# Patient Record
Sex: Female | Born: 1960 | Race: Asian | Hispanic: No | Marital: Married | Smoking: Never smoker
Health system: Southern US, Community
[De-identification: ages and names within clinical notes are randomized; demographics above are authoritative.]

## PROBLEM LIST (undated history)

## (undated) DIAGNOSIS — Z8249 Family history of ischemic heart disease and other diseases of the circulatory system: Secondary | ICD-10-CM

## (undated) DIAGNOSIS — I10 Essential (primary) hypertension: Secondary | ICD-10-CM

## (undated) DIAGNOSIS — E079 Disorder of thyroid, unspecified: Secondary | ICD-10-CM

## (undated) DIAGNOSIS — T7840XA Allergy, unspecified, initial encounter: Secondary | ICD-10-CM

## (undated) DIAGNOSIS — R7303 Prediabetes: Secondary | ICD-10-CM

## (undated) DIAGNOSIS — E785 Hyperlipidemia, unspecified: Secondary | ICD-10-CM

## (undated) HISTORY — DX: Allergy, unspecified, initial encounter: T78.40XA

## (undated) HISTORY — DX: Prediabetes: R73.03

## (undated) HISTORY — DX: Essential (primary) hypertension: I10

## (undated) HISTORY — DX: Disorder of thyroid, unspecified: E07.9

## (undated) HISTORY — DX: Family history of ischemic heart disease and other diseases of the circulatory system: Z82.49

## (undated) HISTORY — DX: Hyperlipidemia, unspecified: E78.5

---

## 2011-10-06 ENCOUNTER — Ambulatory Visit (INDEPENDENT_AMBULATORY_CARE_PROVIDER_SITE_OTHER): Payer: BC Managed Care – PPO

## 2011-10-06 DIAGNOSIS — N951 Menopausal and female climacteric states: Secondary | ICD-10-CM

## 2011-10-06 DIAGNOSIS — E789 Disorder of lipoprotein metabolism, unspecified: Secondary | ICD-10-CM

## 2011-10-06 DIAGNOSIS — I10 Essential (primary) hypertension: Secondary | ICD-10-CM

## 2011-10-06 DIAGNOSIS — R209 Unspecified disturbances of skin sensation: Secondary | ICD-10-CM

## 2012-06-02 ENCOUNTER — Encounter: Payer: BC Managed Care – PPO | Admitting: Family Medicine

## 2012-06-17 ENCOUNTER — Ambulatory Visit: Payer: BC Managed Care – PPO

## 2012-06-17 ENCOUNTER — Encounter: Payer: Self-pay | Admitting: Family Medicine

## 2012-06-17 ENCOUNTER — Ambulatory Visit (INDEPENDENT_AMBULATORY_CARE_PROVIDER_SITE_OTHER): Payer: BC Managed Care – PPO | Admitting: Family Medicine

## 2012-06-17 VITALS — BP 116/72 | HR 87 | Temp 98.2°F | Resp 16 | Ht 62.5 in | Wt 169.4 lb

## 2012-06-17 DIAGNOSIS — Z23 Encounter for immunization: Secondary | ICD-10-CM

## 2012-06-17 DIAGNOSIS — Z01419 Encounter for gynecological examination (general) (routine) without abnormal findings: Secondary | ICD-10-CM

## 2012-06-17 DIAGNOSIS — Z1231 Encounter for screening mammogram for malignant neoplasm of breast: Secondary | ICD-10-CM

## 2012-06-17 DIAGNOSIS — M542 Cervicalgia: Secondary | ICD-10-CM

## 2012-06-17 DIAGNOSIS — Z0000 Encounter for general adult medical examination without abnormal findings: Secondary | ICD-10-CM

## 2012-06-17 DIAGNOSIS — Z124 Encounter for screening for malignant neoplasm of cervix: Secondary | ICD-10-CM

## 2012-06-17 DIAGNOSIS — E785 Hyperlipidemia, unspecified: Secondary | ICD-10-CM

## 2012-06-17 DIAGNOSIS — E039 Hypothyroidism, unspecified: Secondary | ICD-10-CM

## 2012-06-17 LAB — POCT URINALYSIS DIPSTICK
Bilirubin, UA: NEGATIVE
Glucose, UA: NEGATIVE
Ketones, UA: NEGATIVE

## 2012-06-17 LAB — IFOBT (OCCULT BLOOD): IFOBT: NEGATIVE

## 2012-06-17 NOTE — Patient Instructions (Signed)
Keeping You Healthy  Get These Tests  Blood Pressure- Have your blood pressure checked by your healthcare provider at least once a year.  Normal blood pressure is 120/80.  Weight- Have your body mass index (BMI) calculated to screen for obesity.  BMI is a measure of body fat based on height and weight.  You can calculate your own BMI at www.nhlbisupport.com/bmi/  Cholesterol- Have your cholesterol checked every year.  Diabetes- Have your blood sugar checked every year if you have high blood pressure, high cholesterol, a family history of diabetes or if you are overweight.  Pap Smear- Have a pap smear every 1 to 3 years if you have been sexually active.  If you are older than 65 and recent pap smears have been normal you may not need additional pap smears.  In addition, if you have had a hysterectomy  For benign disease additional pap smears are not necessary.  Mammogram-Yearly mammograms are essential for early detection of breast cancer  Screening for Colon Cancer- Colonoscopy starting at age 50. Screening may begin sooner depending on your family history and other health conditions.  Follow up colonoscopy as directed by your Gastroenterologist.  Screening for Osteoporosis- Screening begins at age 65 with bone density scanning, sooner if you are at higher risk for developing Osteoporosis.  Get these medicines  Calcium with Vitamin D- Your body requires 1200-1500 mg of Calcium a day and 800-1000 IU of Vitamin D a day.  You can only absorb 500 mg of Calcium at a time therefore Calcium must be taken in 2 or 3 separate doses throughout the day.  Hormones- Hormone therapy has been associated with increased risk for certain cancers and heart disease.  Talk to your healthcare provider about if you need relief from menopausal symptoms.  Aspirin- Ask your healthcare provider about taking Aspirin to prevent Heart Disease and Stroke.  Get these Immuniztions  Flu shot- Every fall  Pneumonia  shot- Once after the age of 65; if you are younger ask your healthcare provider if you need a pneumonia shot.  Tetanus- Every ten years.  Zostavax- Once after the age of 60 to prevent shingles.  Take these steps  Don't smoke- Your healthcare provider can help you quit. For tips on how to quit, ask your healthcare provider or go to www.smokefree.gov or call 1-800 QUIT-NOW.  Be physically active- Exercise 5 days a week for a minimum of 30 minutes.  If you are not already physically active, start slow and gradually work up to 30 minutes of moderate physical activity.  Try walking, dancing, bike riding, swimming, etc.  Eat a healthy diet- Eat a variety of healthy foods such as fruits, vegetables, whole grains, low fat milk, low fat cheeses, yogurt, lean meats, chicken, fish, eggs, dried beans, tofu, etc.  For more information go to www.thenutritionsource.org  Dental visit- Brush and floss teeth twice daily; visit your dentist twice a year.  Eye exam- Visit your Optometrist or Ophthalmologist yearly.  Drink alcohol in moderation- Limit alcohol intake to one drink or less a day.  Never drink and drive.  Depression- Your emotional health is as important as your physical health.  If you're feeling down or losing interest in things you normally enjoy, please talk to your healthcare provider.  Seat Belts- can save your life; always wear one  Smoke/Carbon Monoxide detectors- These detectors need to be installed on the appropriate level of your home.  Replace batteries at least once a year.  Violence- If anyone   is threatening or hurting you, please tell your healthcare provider.  Living Will/ Health care power of attorney- Discuss with your healthcare provider and family.     Degenerative Disc Disease Degenerative disc disease is a condition caused by the changes that occur in the cushions of the backbone (spinal discs) as you grow older. Spinal discs are soft and compressible discs located  between the bones of the spine (vertebrae). They act like shock absorbers. Degenerative disc disease can affect the wholespine. However, the neck and lower back are most commonly affected. Many changes can occur in the spinal discs with aging, such as:  The spinal discs may dry and shrink.   Small tears may occur in the tough, outer covering of the disc (annulus).   The disc space may become smaller due to loss of water.   Abnormal growths in the bone (spurs) may occur. This can put pressure on the nerve roots exiting the spinal canal, causing pain.   The spinal canal may become narrowed.  CAUSES  Degenerative disc disease is a condition caused by the changes that occur in the spinal discs with aging. The exact cause is not known, but there is a genetic basis for many patients. Degenerative changes can occur due to loss of fluid in the disc. This makes the disc thinner and reduces the space between the backbones. Small cracks can develop in the outer layer of the disc. This can lead to the breakdown of the disc. You are more likely to get degenerative disc disease if you are overweight. Smoking cigarettes and doing heavy work such as weightlifting can also increase your risk of this condition. Degenerative changes can start after a sudden injury. Growth of bone spurs can compress the nerve roots and cause pain.  SYMPTOMS  The symptoms vary from person to person. Some people may have no pain, while others have severe pain. The pain may be so severe that it can limit your activities. The location of the pain depends on the part of your backbone that is affected. You will have neck or arm pain if a disc in the neck area is affected. You will have pain in your back, buttocks, or legs if a disc in the lower back is affected. The pain becomes worse while bending, reaching up, or with twisting movements. The pain may start gradually and then get worse as time passes. It may also start after a major or minor  injury. You may feel numbness or tingling in the arms or legs.  DIAGNOSIS  Your caregiver will ask you about your symptoms and about activities or habits that may cause the pain. He or she may also ask about any injuries, diseases, ortreatments you have had earlier. Your caregiver will examine you to check for the range of movement that is possible in the affected area, to check for strength in your extremities, and to check for sensation in the areas of the arms and legs supplied by different nerve roots. An X-ray of the spine may be taken. Your caregiver may suggest other imaging tests, such as a computerized magnetic scan (MRI), if needed.  TREATMENT  Treatment includes rest, modifying your activities, and applying ice and heat. Your caregiver may prescribe medicines to reduce your pain and may ask you to do some exercises to strengthen your back. In some cases, you may need surgery. You and your caregiver will decide on the treatment that is best for you. HOME CARE INSTRUCTIONS   Follow proper  lifting and walking techniques as advised by your caregiver.   Maintain good posture.   Exercise regularly as advised.   Perform relaxation exercises.   Change your sitting, standing, and sleeping habits as advised. Change positions frequently.   Lose weight as advised.   Stop smoking if you smoke.   Wear supportive footwear.  SEEK MEDICAL CARE IF:  The pain does not go away within 1 to 4 weeks. SEEK IMMEDIATE MEDICAL CARE IF:   The pain is severe.   You notice weakness in your arms, hands, or legs.   You begin to lose control of your bladder or bowel.  MAKE SURE YOU:   Understand these instructions.   Will watch your condition.   Will get help right away if you are not doing well or get worse.  Document Released: 07/27/2007 Document Revised: 09/18/2011 Document Reviewed: 07/27/2007 [facility] Patient Information 2012 [place], [place].

## 2012-06-17 NOTE — Progress Notes (Signed)
Subjective:    Patient ID: Alice Anderson, female    DOB: [birth year], 51 y.o.   MRN: [id number]  HPI This 51 y.o. female has HTN , Hypothyroidism and Lipid disorder. She has no adverse effects  with current medications. She is employed as a Runner, broadcasting/film/video, is married, is a nonsmoker and does not  consume alcohol. Admits to no regular exercise.   Last PAP- 2009 (normal)  Last MMG: 2009 (normal)  Pt is not interested in Colonoscopy at this time.    Review of Systems  Neurological: Positive for numbness.       Occasional left arm numbness, lasting a few minutes; onset 8-10 months ago.  All other systems reviewed and are negative.       Objective:   Physical Exam  Nursing note and vitals reviewed. Constitutional: She is oriented to person, place, and time. She appears well-developed and well-nourished. No distress.  HENT:  Head: Normocephalic and atraumatic.  Right Ear: Hearing, tympanic membrane, external ear and ear canal normal.  Left Ear: Hearing, tympanic membrane and external ear normal.  Nose: Nose normal. No nasal deformity or septal deviation.  Mouth/Throat: Uvula is midline and oropharynx is clear and moist. No oral lesions. Normal dentition. No dental caries.  Eyes: Conjunctivae and EOM are normal. Pupils are equal, round, and reactive to light. No scleral icterus.  Neck: Normal range of motion. Neck supple. No thyromegaly present.       Mild tenderness to left of midline but otherwise normal.  Cardiovascular: Normal rate, regular rhythm, normal heart sounds and intact distal pulses.  Exam reveals no gallop and no friction rub.   No murmur heard. Pulmonary/Chest: Effort normal and breath sounds normal. No respiratory distress. Right breast exhibits no inverted nipple, no mass, no nipple discharge, no skin change and no tenderness. Left breast exhibits no inverted nipple, no mass, no nipple discharge, no skin change and no tenderness. Breasts are symmetrical.  Abdominal: Soft.  Bowel sounds are normal. She exhibits no distension, no pulsatile midline mass and no mass. There is no hepatosplenomegaly. There is no tenderness. There is no guarding and no CVA tenderness.  Genitourinary: Rectum normal and uterus normal. Rectal exam shows no external hemorrhoid, no fissure, no mass, no tenderness and anal tone normal. Guaiac negative stool. There is no rash, tenderness or lesion on the right labia. There is no rash, tenderness or lesion on the left labia. Uterus is not enlarged and not tender. Cervix exhibits no motion tenderness, no discharge and no friability. Right adnexum displays no mass, no tenderness and no fullness. Left adnexum displays no mass, no tenderness and no fullness. No erythema or tenderness around the vagina. No signs of injury around the vagina. No vaginal discharge found.  Musculoskeletal: Normal range of motion. She exhibits no edema and no tenderness.  Lymphadenopathy:    She has no cervical adenopathy.       Right: No inguinal adenopathy present.       Left: No inguinal adenopathy present.  Neurological: She is alert and oriented to person, place, and time. She has normal reflexes. No cranial nerve deficit. She exhibits normal muscle tone. Coordination normal.  Skin: Skin is warm and dry. No pallor.  Psychiatric: She has a normal mood and affect. Her behavior is normal. Judgment and thought content normal.     [facility] reading (PRIMARY) by  Dr. 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        Assessment &  Plan:   1. Routine general medical examination at a health care facility  POCT urinalysis dipstick, IFOBT POC (occult bld, rslt in office)  2. Hypothyroidism  TSH  3. Hyperlipidemia LDL goal < 100  Comprehensive metabolic panel, Lipid panel  4. Need for prophylactic vaccination and inoculation against influenza    5. Neck pain  DG Cervical Spine 2-3 Views  6. Encounter for cervical Pap smear with  pelvic exam  Pap IG w/ reflex to HPV when ASC-U  7. Other screening mammogram  MM Digital Screening   Medication refills will be handled after results of labs are back.

## 2012-06-18 LAB — COMPREHENSIVE METABOLIC PANEL
ALT: 47 U/L — ABNORMAL HIGH (ref 0–35)
CO2: 26 mEq/L (ref 19–32)
Calcium: 9.6 mg/dL (ref 8.4–10.5)
Chloride: 102 mEq/L (ref 96–112)
Potassium: 4.2 mEq/L (ref 3.5–5.3)
Sodium: 137 mEq/L (ref 135–145)
Total Protein: 7.1 g/dL (ref 6.0–8.3)

## 2012-06-18 LAB — PAP IG W/ RFLX HPV ASCU

## 2012-06-18 LAB — TSH: TSH: 1.174 u[IU]/mL (ref 0.350–4.500)

## 2012-06-18 LAB — LIPID PANEL: Cholesterol: 137 mg/dL (ref 0–200)

## 2012-06-19 ENCOUNTER — Encounter: Payer: Self-pay | Admitting: *Deleted

## 2012-06-19 ENCOUNTER — Other Ambulatory Visit: Payer: Self-pay | Admitting: Family Medicine

## 2012-06-19 MED ORDER — LEVOTHYROXINE SODIUM 75 MCG PO TABS: 75.0000 ug | ORAL_TABLET | Freq: Every day | ORAL | Status: DC

## 2012-06-19 MED ORDER — LISINOPRIL 20 MG PO TABS: 20.0000 mg | ORAL_TABLET | Freq: Every day | ORAL | Status: DC

## 2012-06-19 MED ORDER — ATORVASTATIN CALCIUM 40 MG PO TABS: 40.0000 mg | ORAL_TABLET | Freq: Every day | ORAL | Status: DC

## 2012-06-19 NOTE — Progress Notes (Signed)
Quick Note:  I called pt's home number and left a message requesting she call to sch a follow-up appt at [phone] to discuss the neck xrays in detail. ______

## 2012-06-19 NOTE — Progress Notes (Signed)
Quick Note:  Please call pt and advise that the following labs are abnormal... All labs are normal except very slight elevation of liver tests- stable compared to Dec 2012. Lipids are well controlled on current dose of medication. Thyroid test shows that current dose of thyroid medication is adequate. PAP is normal/negative.  I have routed refills of needed meds to pharmacy. I have authorized 90-day supply on all 3 meds with 1 RF.  Copy to pt.   ______

## 2012-06-21 ENCOUNTER — Telehealth: Payer: Self-pay

## 2012-06-21 NOTE — Telephone Encounter (Signed)
LEFT MESSAGE FOR PT TO CALL AND SCHEDULE APPT WITH DR [facility]  WITHIN  ONE WEEK TO DISCUSS XRAYS.

## 2012-06-22 ENCOUNTER — Encounter: Payer: Self-pay | Admitting: Family Medicine

## 2012-06-22 DIAGNOSIS — M5090 Cervical disc disorder, unspecified, unspecified cervical region: Secondary | ICD-10-CM | POA: Insufficient documentation

## 2012-06-22 DIAGNOSIS — E669 Obesity, unspecified: Secondary | ICD-10-CM | POA: Insufficient documentation

## 2012-06-22 DIAGNOSIS — E039 Hypothyroidism, unspecified: Secondary | ICD-10-CM | POA: Insufficient documentation

## 2012-06-22 DIAGNOSIS — E785 Hyperlipidemia, unspecified: Secondary | ICD-10-CM | POA: Insufficient documentation

## 2012-06-24 ENCOUNTER — Encounter: Payer: Self-pay | Admitting: Family Medicine

## 2012-06-24 ENCOUNTER — Encounter: Payer: Self-pay | Admitting: Emergency Medicine

## 2012-09-02 ENCOUNTER — Ambulatory Visit (INDEPENDENT_AMBULATORY_CARE_PROVIDER_SITE_OTHER): Payer: BC Managed Care – PPO | Admitting: Family Medicine

## 2012-09-02 ENCOUNTER — Encounter: Payer: Self-pay | Admitting: Family Medicine

## 2012-09-02 VITALS — BP 118/76 | HR 86 | Temp 98.1°F | Resp 18 | Ht 62.0 in | Wt 165.0 lb

## 2012-09-02 DIAGNOSIS — M62830 Muscle spasm of back: Secondary | ICD-10-CM

## 2012-09-02 DIAGNOSIS — M5380 Other specified dorsopathies, site unspecified: Secondary | ICD-10-CM

## 2012-09-02 DIAGNOSIS — R937 Abnormal findings on diagnostic imaging of other parts of musculoskeletal system: Secondary | ICD-10-CM

## 2012-09-02 MED ORDER — NABUMETONE 500 MG PO TABS: 500.0000 mg | ORAL_TABLET | Freq: Two times a day (BID) | ORAL | Status: DC

## 2012-09-02 MED ORDER — CYCLOBENZAPRINE HCL 5 MG PO TABS: ORAL_TABLET | ORAL | Status: DC

## 2012-09-05 NOTE — Progress Notes (Signed)
S: This pt returns for re-evaluation after being asked to return in September to discuss abnormal C-spine xray result. She continue to have neck discomfort and limited ROM with rotation. She has some radicular symptoms with tingling and burning in left arm. This is intermittent. She gets minimal relief with OTC medications.  ROS: Negative for diaphoresis, fatigue, fever/chhills, CP or tightness, SOB or cough, numbness or weakness in extremities, gait abnormality, dizziness, HA or syncope.  O:  Filed Vitals:   09/02/12 1518  BP: 118/76  Pulse: 86  Temp: 98.1 F (36.7 C)  Resp: 18   GEN: In NAD, WN,WD. HENT: [place]/AT. Benign otherwise. NECK: Decreased ROM w/ rotation esp. to left.  COR: RRR. LUNGS: Normal resp rate and effort. BACK: Spine is straight w/o deformities. MS: Tender in rhomboids and supraspinatous muscles. NEURO: A&O x 3; CNs intact. DTRs 2+/=. Grip is normal in both hands. Gait is normal.  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  A/P: 1. Abnormal x-ray of cervical spine  C-spine results reviewed w/ pt. MR Cervical Spine Wo Contrast  2. Muscle spasm of back  RX: Nabumetone 500 mg 1 tab bid w/ food. RX: Cyclobenzaprine 5 mg 1 or 2 tabs hs for spasms.

## 2012-10-26 ENCOUNTER — Other Ambulatory Visit: Payer: Self-pay

## 2012-11-01 ENCOUNTER — Ambulatory Visit
Admission: RE | Admit: 2012-11-01 | Discharge: 2012-11-01 | Disposition: A | Discharge status: Home / Self Care | Payer: BC Managed Care – PPO | Source: Ambulatory Visit | Attending: Family Medicine | Admitting: Family Medicine

## 2012-11-01 DIAGNOSIS — R937 Abnormal findings on diagnostic imaging of other parts of musculoskeletal system: Secondary | ICD-10-CM

## 2012-11-02 ENCOUNTER — Telehealth: Payer: Self-pay | Admitting: Family Medicine

## 2012-11-02 NOTE — Telephone Encounter (Signed)
Pt had C-spine MRI on 10/27/12; I called to review results w/ pt. Left a message of reassurance ("nothing serious- arthritis in neck") and suggested she sch appt to come in at her convenience so we can review MRI in person.

## 2013-01-27 ENCOUNTER — Ambulatory Visit (INDEPENDENT_AMBULATORY_CARE_PROVIDER_SITE_OTHER): Payer: BC Managed Care – PPO | Admitting: Family Medicine

## 2013-01-27 ENCOUNTER — Encounter: Payer: Self-pay | Admitting: Family Medicine

## 2013-01-27 VITALS — BP 128/84 | HR 99 | Temp 98.2°F | Resp 16 | Ht 62.5 in | Wt 165.0 lb

## 2013-01-27 DIAGNOSIS — I10 Essential (primary) hypertension: Secondary | ICD-10-CM

## 2013-01-27 DIAGNOSIS — E039 Hypothyroidism, unspecified: Secondary | ICD-10-CM

## 2013-01-27 DIAGNOSIS — R252 Cramp and spasm: Secondary | ICD-10-CM

## 2013-01-27 DIAGNOSIS — N951 Menopausal and female climacteric states: Secondary | ICD-10-CM

## 2013-01-27 LAB — T4, FREE: Free T4: 1.05 ng/dL (ref 0.80–1.80)

## 2013-01-27 LAB — TSH: TSH: 0.994 u[IU]/mL (ref 0.350–4.500)

## 2013-01-27 LAB — COMPREHENSIVE METABOLIC PANEL
ALT: 40 U/L — ABNORMAL HIGH (ref 0–35)
Alkaline Phosphatase: 84 U/L (ref 39–117)
CO2: 25 mEq/L (ref 19–32)
Potassium: 4.5 mEq/L (ref 3.5–5.3)
Sodium: 136 mEq/L (ref 135–145)
Total Bilirubin: 0.5 mg/dL (ref 0.3–1.2)
Total Protein: 7.2 g/dL (ref 6.0–8.3)

## 2013-01-27 MED ORDER — LISINOPRIL 20 MG PO TABS: ORAL_TABLET | ORAL | Status: DC

## 2013-01-27 MED ORDER — ATORVASTATIN CALCIUM 20 MG PO TABS: ORAL_TABLET | ORAL | Status: DC

## 2013-01-27 MED ORDER — LEVOTHYROXINE SODIUM 75 MCG PO TABS: 75.0000 ug | ORAL_TABLET | Freq: Every day | ORAL | Status: DC

## 2013-01-27 NOTE — Progress Notes (Signed)
S:  This 52  y.o. female has HTN and is compliant w/ medications. She reports some lightheadedness in early AM along with brief palpitations. She maintains good hydration and nutrition. Medication adverse effect such as cough has no been an issue. She does c/o muscle cramps w/o pain or weakness or tremor.  Thyroid disorder seems to be stable as pt has no changes in energy level, GI function or skin/hair changes.  Pt having hot flushing during the day and night sweats. This is not a daily occurrence but she would like some advise about curtailing these episodes. Last menses > 12 months ago.   ROS: As per HPI. Otherwise noncontributory.  O:  Filed Vitals:   01/27/13 1300  BP: 128/84  Pulse: 99  Temp: 98.2 F (36.8 C)  Resp: 16   GEN: In NAD; WN,WD. HENT: /AT; EOMI w/ clear conj/ sclerae. EACs normal. Oroph clear and moist. NECK: Supple w/o LAN or TMG. COR: RRR. No m/g/r. LUNGS: CTA; normal resp rate and effort. SKIN: W&D; no rashes, erythema or pallor. MS: MAEs; NT muscles w/ good tone, w/o atrophy.  No c/c/e. NEURO: A&O x 3; CNs intact. DTRs 1-2+/= in UEs and LEs. Nonfocal.  A/P: HTN, goal below 140/90 -stable; discussed reducing medication to Lisinopril 20 mg to 1/2 tablet daily. 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  Plan: Vitamin D 25 hydroxy, TSH, Comprehensive metabolic panel, T4, Free, Hepatitis C antibody   Meds ordered this encounter  Medications  . DISCONTD: levothyroxine (SYNTHROID, LEVOTHROID) 75 MCG tablet    Sig: Take 1 tablet (75 mcg total) by  mouth daily.    Dispense:  90 tablet    Refill:  3  . atorvastatin (LIPITOR) 20 MG tablet    Sig: Take 1 tablet at bedtime.    Dispense:  30 tablet    Refill:  5  . DISCONTD: lisinopril (PRINIVIL,ZESTRIL) 20 MG tablet    Sig: Take 1/2 tablet daily to lower blood pressure.    Dispense:  90 tablet    Refill:  3  . levothyroxine (SYNTHROID, LEVOTHROID) 75 MCG tablet    Sig: Take 1 tablet (75 mcg total) by mouth daily.    Dispense:  90 tablet    Refill:  3  . lisinopril (PRINIVIL,ZESTRIL) 20 MG tablet    Sig: Take 1/2 tablet daily to lower blood pressure.    Dispense:  50 tablet    Refill:  3

## 2013-01-27 NOTE — Patient Instructions (Addendum)
Two of your meds have been changed: Atorvastatin 20 mg 1 tablet at bedtime and Lisinopril 20 mg is now 1/2 tablet daily. Take your thyroid medication separate from your other medications.   Menopause Menopause is the normal time of life when menstrual periods stop completely. Menopause is complete when you have missed 12 consecutive menstrual periods. It usually occurs between the ages of 24 to 14, with an average age of 51. Very rarely does a woman develop menopause before 51 years old. At menopause, your ovaries stop producing the female hormones, estrogen and progesterone. This can cause undesirable symptoms and also affect your health. Sometimes the symptoms may occur 4 to 5 years before the menopause begins. There is no relationship between menopause and:  Oral contraceptives.  Number of children you had.  Race.  The age your menstrual periods started (menarche). Heavy smokers and very thin women may develop menopause earlier in life. CAUSES  The ovaries stop producing the female hormones estrogen and progesterone.  Other causes include:  Surgery to remove both ovaries.  The ovaries stop functioning for no known reason.  Tumors of the pituitary gland in the brain.  Medical disease that affects the ovaries and hormone production.  Radiation treatment to the abdomen or pelvis.  Chemotherapy that affects the ovaries. SYMPTOMS   Hot flashes.  Night sweats.  Decrease in sex drive.  Vaginal dryness and thinning of the vagina causing painful intercourse.  Dryness of the skin and developing wrinkles.  Headaches.  Tiredness.  Irritability.  Memory problems.  Weight gain.  Bladder infections.  Hair growth of the face and chest.  Infertility. More serious symptoms include:  Loss of bone (osteoporosis) causing breaks (fractures).  Depression.  Hardening and narrowing of the arteries (atherosclerosis) causing heart attacks and strokes. DIAGNOSIS   When the  menstrual periods have stopped for 12 straight months.  Physical exam.  Hormone studies of the blood. TREATMENT  There are many treatment choices and nearly as many questions about them. The decisions to treat or not to treat menopausal changes is an individual choice made with your caregiver. Your caregiver can discuss the treatments with you. Together, you can decide which treatment will work best for you. Your treatment choices may include:   Hormone therapy (estorgen and progesterone).  Non-hormonal medications.  Treating the individual symptoms with medication (for example antidepressants for depression).  Herbal medications that may help specific symptoms.  Counseling by a psychiatrist or psychologist.  Group therapy.  Lifestyle changes including:  Eating healthy.  Regular exercise.  Limiting caffeine and alcohol.  Stress management and meditation.  No treatment. HOME CARE INSTRUCTIONS   Take the medication your caregiver gives you as directed.  Get plenty of sleep and rest.  Exercise regularly.  Eat a diet that contains calcium (good for the bones) and soy products (acts like estrogen hormone).  Avoid alcoholic beverages.  Do not smoke.  If you have hot flashes, dress in layers.  Take supplements, calcium and vitamin D to strengthen bones.  You can use over-the-counter lubricants or moisturizers for vaginal dryness.  Group therapy is sometimes very helpful.  Acupuncture may be helpful in some cases. SEEK MEDICAL CARE IF:   You are not sure you are in menopause.  You are having menopausal symptoms and need advice and treatment.  You are still having menstrual periods after age 63.  You have pain with intercourse.  Menopause is complete (no menstrual period for 12 months) and you develop vaginal bleeding.  You need a referral to a specialist (gynecologist, psychiatrist or psychologist) for treatment. SEEK IMMEDIATE MEDICAL CARE IF:   You have  severe depression.  You have excessive vaginal bleeding.  You fell and think you have a broken bone.  You have pain when you urinate.  You develop leg or chest pain.  You have a fast pounding heart beat (palpitations).  You have severe headaches.  You develop vision problems.  You feel a lump in your breast.  You have abdominal pain or severe indigestion. Document Released: 12/20/2003 Document Revised: 12/22/2011 Document Reviewed: 07/27/2008 [facility] Patient Information 2013 [place], [place].

## 2013-02-01 ENCOUNTER — Telehealth: Payer: Self-pay

## 2013-02-01 NOTE — Telephone Encounter (Signed)
Spoke with pt advised lab results.

## 2013-02-01 NOTE — Progress Notes (Signed)
Quick Note:  Please contact pt and advise that the following labs are abnormal... Vitamin D level is below normal ; get an over-the-counter supplement Vitamin D3 2000 IU and take it daily. Try to get some daily sun exposure 10-15 minutes most days of the week. Thyroid tests indicate adequate medication dose. Test for Hepatitis C is negative. Chemistry profile, kidney function and liver tests are normal.  Copy to pt. ______

## 2013-02-01 NOTE — Telephone Encounter (Signed)
Patient would like results of her bw done on 01/27/13 please call her back @ [phone] thanks

## 2013-03-17 ENCOUNTER — Telehealth: Payer: Self-pay

## 2013-03-17 DIAGNOSIS — I8390 Asymptomatic varicose veins of unspecified lower extremity: Secondary | ICD-10-CM

## 2013-03-17 NOTE — Telephone Encounter (Signed)
Pt is calling because she is needing a referral for veins she sees dr Gillie Manners and they have talked about getting a referral for her veins  Call back number is [phone]

## 2013-03-18 NOTE — Telephone Encounter (Signed)
Pt called back and verified that she does want to be evaluated for varicose veins. I advised her of referral and gave her [facility] # and address.

## 2013-03-18 NOTE — Telephone Encounter (Signed)
I have completed a referral to [facility] for eval of varicose veins; need to clarify with pt that "varicose veins" is the problem she wants evaluated.

## 2013-05-10 ENCOUNTER — Encounter: Payer: Self-pay | Admitting: Family Medicine

## 2013-05-18 ENCOUNTER — Telehealth: Payer: Self-pay

## 2013-05-18 NOTE — Telephone Encounter (Signed)
[facility] requested any records pertaining to venous insufficiency, leg pain, or varicose veins but there are no notes regarding these issues in chart. Sent fax letting them know.

## 2013-07-14 ENCOUNTER — Encounter: Payer: Self-pay | Admitting: Family Medicine

## 2013-07-14 ENCOUNTER — Telehealth: Payer: Self-pay | Admitting: *Deleted

## 2013-07-14 ENCOUNTER — Ambulatory Visit (INDEPENDENT_AMBULATORY_CARE_PROVIDER_SITE_OTHER): Payer: BC Managed Care – PPO | Admitting: Family Medicine

## 2013-07-14 VITALS — BP 114/73 | HR 76 | Temp 97.8°F | Resp 16 | Ht 63.0 in | Wt 166.0 lb

## 2013-07-14 DIAGNOSIS — E039 Hypothyroidism, unspecified: Secondary | ICD-10-CM

## 2013-07-14 DIAGNOSIS — Z23 Encounter for immunization: Secondary | ICD-10-CM

## 2013-07-14 DIAGNOSIS — Z0000 Encounter for general adult medical examination without abnormal findings: Secondary | ICD-10-CM

## 2013-07-14 DIAGNOSIS — I8393 Asymptomatic varicose veins of bilateral lower extremities: Secondary | ICD-10-CM

## 2013-07-14 DIAGNOSIS — E785 Hyperlipidemia, unspecified: Secondary | ICD-10-CM

## 2013-07-14 DIAGNOSIS — I10 Essential (primary) hypertension: Secondary | ICD-10-CM

## 2013-07-14 DIAGNOSIS — Z1211 Encounter for screening for malignant neoplasm of colon: Secondary | ICD-10-CM

## 2013-07-14 DIAGNOSIS — I8390 Asymptomatic varicose veins of unspecified lower extremity: Secondary | ICD-10-CM | POA: Insufficient documentation

## 2013-07-14 LAB — POCT URINALYSIS DIPSTICK
Bilirubin, UA: NEGATIVE
Glucose, UA: NEGATIVE
Ketones, UA: NEGATIVE
Spec Grav, UA: 1.02
Urobilinogen, UA: 0.2

## 2013-07-14 LAB — LIPID PANEL
Cholesterol: 124 mg/dL (ref 0–200)
LDL Cholesterol: 59 mg/dL (ref 0–99)
Triglycerides: 132 mg/dL (ref <150)
VLDL: 26 mg/dL (ref 0–40)

## 2013-07-14 LAB — COMPREHENSIVE METABOLIC PANEL
ALT: 24 U/L (ref 0–35)
Albumin: 4.3 g/dL (ref 3.5–5.2)
CO2: 28 mEq/L (ref 19–32)
Glucose, Bld: 94 mg/dL (ref 70–99)
Potassium: 4.2 mEq/L (ref 3.5–5.3)
Sodium: 139 mEq/L (ref 135–145)
Total Bilirubin: 0.5 mg/dL (ref 0.3–1.2)
Total Protein: 6.9 g/dL (ref 6.0–8.3)

## 2013-07-14 LAB — IFOBT (OCCULT BLOOD): IFOBT: NEGATIVE

## 2013-07-14 MED ORDER — ATORVASTATIN CALCIUM 20 MG PO TABS: ORAL_TABLET | ORAL | Status: DC

## 2013-07-14 NOTE — Telephone Encounter (Signed)
Called patient to advise her to make appointment for right breast procedure to remove skin lesion with C Jeffery,PA-C. Per Dr Audria Nine. She will call back to schedule appointment.

## 2013-07-14 NOTE — Patient Instructions (Addendum)

## 2013-07-14 NOTE — Progress Notes (Signed)
Subjective:    Patient ID: Alice Anderson, female    DOB: 02/22/1961, 52 y.o.   MRN: [id number]  HPI  This 52 y.o. female is here for annual CPE. [facility] have been treating varicose veins which pt did mention at a previous visit (not documented); at that time, pt was not initially interested in being evaluated by a specialist. However, she did call back in June of this year requesting referral for complete evaluation by a specialist. She has had varicose veins for years and they have been increasingly painful in last 6 months.   HCM: MMG- current.           PAP- 2013 (negative).           IMM- current.           Vision- current.    Review of Systems  Constitutional: Negative.   HENT: Negative.   Eyes: Negative.   Respiratory: Negative.   Cardiovascular: Negative.        Lower ext superficial varicosities.  Gastrointestinal: Negative.   Endocrine: Negative.   Genitourinary: Positive for pelvic pain.       Intermittent LLQ fleeting pain.   Musculoskeletal: Negative.   Skin: Negative.   Allergic/Immunologic: Negative.   Neurological: Negative.   Hematological: Negative.   Psychiatric/Behavioral: Negative.        Objective:   Physical Exam  Nursing note and vitals reviewed. Constitutional: She is oriented to person, place, and time. She appears well-developed and well-nourished. No distress.  HENT:  Head: Normocephalic and atraumatic.  Right Ear: Hearing, tympanic membrane, external ear and ear canal normal.  Left Ear: Hearing and external ear normal.  Nose: Nose normal.  Mouth/Throat: Uvula is midline, oropharynx is clear and moist and mucous membranes are normal. No oral lesions. Normal dentition. No dental caries.  L cerumen impaction.  Eyes: Conjunctivae, EOM and lids are normal. Pupils are equal, round, and reactive to light. No scleral icterus.  Fundoscopic exam:      The right eye shows no arteriolar narrowing, no AV nicking and no papilledema.  The right eye shows red reflex.       The left eye shows no arteriolar narrowing, no AV nicking and no papilledema. The left eye shows red reflex.  Neck: Full passive range of motion without pain. Neck supple. No JVD present. No spinous process tenderness and no muscular tenderness present. Carotid bruit is not present. No mass and no thyromegaly present.  Cardiovascular: Normal rate, regular rhythm, S1 normal, S2 normal, normal heart sounds, intact distal pulses and normal pulses.   No extrasystoles are present. PMI is not displaced.  Exam reveals no gallop and no friction rub.   No murmur heard. Pulses:      Carotid pulses are 2+ on the right side, and 2+ on the left side.      Radial pulses are 2+ on the right side, and 2+ on the left side.       Femoral pulses are 2+ on the right side, and 2+ on the left side.      Popliteal pulses are 2+ on the right side, and 2+ on the left side.       Dorsalis pedis pulses are 2+ on the right side, and 2+ on the left side.       Posterior tibial pulses are 2+ on the right side, and 2+ on the left side.  Pt has lower extremity varicosities bilaterally. No ulcerations are noted. Mildly  tender to palpation.  Pulmonary/Chest: Effort normal and breath sounds normal. No respiratory distress. Right breast exhibits no inverted nipple, no mass, no nipple discharge, no skin change and no tenderness. Left breast exhibits no inverted nipple, no mass, no nipple discharge, no skin change and no tenderness. Breasts are symmetrical.    Abdominal: Soft. Normal appearance and bowel sounds are normal. She exhibits no distension, no pulsatile midline mass and no mass. There is no hepatosplenomegaly. There is no tenderness. There is no rebound, no guarding and no CVA tenderness.  Genitourinary: Vagina normal. There is no rash, tenderness or lesion on the right labia. There is no rash, tenderness or lesion on the left labia.  Musculoskeletal: Normal range of motion. She exhibits  no edema and no tenderness.  Lymphadenopathy:       Head (right side): No submental, no submandibular, no tonsillar, no posterior auricular and no occipital adenopathy present.       Head (left side): No submental, no submandibular, no tonsillar, no posterior auricular and no occipital adenopathy present.    She has no cervical adenopathy.    She has no axillary adenopathy.       Right: No inguinal and no supraclavicular adenopathy present.       Left: No inguinal and no supraclavicular adenopathy present.  Neurological: She is alert and oriented to person, place, and time. She has normal strength and normal reflexes. She displays no atrophy. No cranial nerve deficit or sensory deficit. She exhibits normal muscle tone. Coordination and gait normal.  Skin: Skin is warm, dry and intact. Lesion noted. No rash noted. She is not diaphoretic. No cyanosis or erythema. No pallor. Nails show no clubbing.  Upper R breast- 1 cm black-head w/ defect in overlying skin.  Psychiatric: She has a normal mood and affect. Her speech is normal and behavior is normal. Judgment and thought content normal. Cognition and memory are normal.     Results for orders placed in visit on 07/14/13  POCT URINALYSIS DIPSTICK      Result Value Range   Color, UA yellow     Clarity, UA clear     Glucose, UA neg     Bilirubin, UA neg     Ketones, UA neg     Spec Grav, UA 1.020     Blood, UA trace     pH, UA 5.0     Protein, UA neg     Urobilinogen, UA 0.2     Nitrite, UA neg     Leukocytes, UA Negative    IFOBT (OCCULT BLOOD)      Result Value Range   IFOBT Negative      ECG: NSR; no ST-TW changes or ectopy.      Assessment & Plan:  Routine general medical examination at a health care facility - Plan: POCT urinalysis dipstick, IFOBT POC (occult bld, rslt in office), Comprehensive metabolic panel, EKG 12-Lead  Hypothyroidism - Continue current medication dose pending labs.   Plan: TSH, T3, Free, T4,  Free  Hyperlipidemia LDL goal < 100 - Plan: Lipid panel  HTN, goal below 140/90 - Stable and controlled; continue current medication.  Plan: Comprehensive metabolic panel  Superficial varicosities, bilateral- pt needs this note faxed to [facility] at [address]  [place]- [phone].  Need for prophylactic vaccination and inoculation against influenza -  Plan: Flu Vaccine QUAD 36+ mos IM  Screening for colorectal cancer - Plan: Ambulatory referral to Gastroenterology  Pt will return to clinic for excision of skin lesion by PA-C.

## 2013-07-15 LAB — T4, FREE: Free T4: 1.13 ng/dL (ref 0.80–1.80)

## 2013-07-15 LAB — T3, FREE: T3, Free: 3.6 pg/mL (ref 2.3–4.2)

## 2013-07-15 NOTE — Progress Notes (Signed)
Quick Note:  Please advise pt regarding following labs...  Lipid panel looks good but HDL ("good") cholesterol needs to be higher; regular exercise and healthy nutrition (The Mediterranean Diet is an excellent guide) can help increase HDL. Your heart disease risk is still very low so that is great news.  Thyroid test (TSH) is not in the normal range but the other 2 thyroid tests (Free T3 and Free T4) that measure the active hormone are in the normal range. I will not change medication dose at this time but these numbers will need to be rechecked in 3-4 months.  Please schedule a visit for January or February to monitor this important medication. All other labs are normal.  Copy to pt.   ______

## 2013-07-20 ENCOUNTER — Encounter: Payer: Self-pay | Admitting: Family Medicine

## 2013-07-20 ENCOUNTER — Telehealth: Payer: Self-pay

## 2013-07-20 NOTE — Telephone Encounter (Signed)
PT is calling because she is wanting to know her lab results from last week She states if she doesn't answer you can leave it on her voicemail Call back number is [phone]

## 2013-07-20 NOTE — Telephone Encounter (Signed)
Advised patient to call if she wants specifics, letter mailed. Left message.

## 2013-09-12 ENCOUNTER — Encounter: Payer: Self-pay | Admitting: Family Medicine

## 2013-09-30 ENCOUNTER — Other Ambulatory Visit: Payer: Self-pay | Admitting: Family Medicine

## 2013-12-23 ENCOUNTER — Ambulatory Visit (INDEPENDENT_AMBULATORY_CARE_PROVIDER_SITE_OTHER): Payer: BC Managed Care – PPO | Admitting: Family Medicine

## 2013-12-23 ENCOUNTER — Encounter: Payer: Self-pay | Admitting: Family Medicine

## 2013-12-23 VITALS — BP 120/78 | HR 72 | Temp 98.2°F | Resp 16 | Ht 63.0 in | Wt 167.0 lb

## 2013-12-23 DIAGNOSIS — E039 Hypothyroidism, unspecified: Secondary | ICD-10-CM

## 2013-12-23 DIAGNOSIS — M5030 Other cervical disc degeneration, unspecified cervical region: Secondary | ICD-10-CM

## 2013-12-23 DIAGNOSIS — I10 Essential (primary) hypertension: Secondary | ICD-10-CM

## 2013-12-23 LAB — CBC WITH DIFFERENTIAL/PLATELET
BASOS ABS: 0 10*3/uL (ref 0.0–0.1)
BASOS PCT: 0 % (ref 0–1)
EOS PCT: 1 % (ref 0–5)
Eosinophils Absolute: 0.1 10*3/uL (ref 0.0–0.7)
HCT: 37.9 % (ref 36.0–46.0)
Hemoglobin: 13 g/dL (ref 12.0–15.0)
Lymphocytes Relative: 49 % — ABNORMAL HIGH (ref 12–46)
Lymphs Abs: 2.5 10*3/uL (ref 0.7–4.0)
MCH: 28.4 pg (ref 26.0–34.0)
MCHC: 34.3 g/dL (ref 30.0–36.0)
MCV: 82.8 fL (ref 78.0–100.0)
Monocytes Absolute: 0.3 10*3/uL (ref 0.1–1.0)
Monocytes Relative: 6 % (ref 3–12)
NEUTROS ABS: 2.3 10*3/uL (ref 1.7–7.7)
Neutrophils Relative %: 44 % (ref 43–77)
PLATELETS: 348 10*3/uL (ref 150–400)
RBC: 4.58 MIL/uL (ref 3.87–5.11)
RDW: 13.5 % (ref 11.5–15.5)
WBC: 5.2 10*3/uL (ref 4.0–10.5)

## 2013-12-23 LAB — THYROID PANEL WITH TSH
FREE THYROXINE INDEX: 3.7 (ref 1.0–3.9)
T3 UPTAKE: 35 % (ref 22.5–37.0)
T4, Total: 10.7 ug/dL (ref 5.0–12.5)
TSH: 0.258 u[IU]/mL — AB (ref 0.350–4.500)

## 2013-12-23 LAB — POCT GLYCOSYLATED HEMOGLOBIN (HGB A1C): HEMOGLOBIN A1C: 5.8

## 2013-12-23 MED ORDER — ATORVASTATIN CALCIUM 20 MG PO TABS: ORAL_TABLET | ORAL | Status: DC

## 2013-12-23 MED ORDER — LISINOPRIL 20 MG PO TABS: ORAL_TABLET | ORAL | Status: DC

## 2013-12-23 MED ORDER — LEVOTHYROXINE SODIUM 75 MCG PO TABS: 75.0000 ug | ORAL_TABLET | Freq: Every day | ORAL | Status: DC

## 2013-12-23 NOTE — Patient Instructions (Signed)
I have referred you to [facility] for treatment of cervical spine degenerative disc disease.  We have discussed nutritional changes that you need to make so that you do not develop Diabetes- better food choices, smaller portions and regular physical exercise.

## 2013-12-23 NOTE — Progress Notes (Signed)
S:  This 53 y.o. female has hypothyroidism and HTN, well controlled. Last thyroid profile showed normal Free T3 and T4 but TSH was below normal range. Pt feels good but would like to feel more energetic. She sleeps well and has a good appetite but carb intake is excessive. She exercises 1-2 times a week.   HTN- checks BP at home on occasion; SBP 110-130. Pt denies diaphoresis, vision changes, CP or tightness, palpitations, SOB or DOE, cough, edema, AH, dizziness, numbness, weakness, tremor or syncope.   Pt has intermittent numbness in both arms, mostly occurs w/ certain arm positions (when holding arms up at shoulder level while driving). She denies pain, tremors, muscle weakness or fatigue or poor grip.  Patient Active Problem List   Diagnosis Date Noted  . 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 HTN, goal below 140/90 01/27/2013  . Hypothyroidism 06/22/2012  . Hyperlipidemia LDL goal < 100 06/22/2012  . Neck pain 06/22/2012  . Obesity (BMI 30.0-34.9) 06/22/2012   PMHx, Surg Hx, Soc and Fam Hx reviewed.  MEDICATIONS reconciled.  ROS: As per HPI.   O: Filed Vitals:   12/23/13 0922  BP: 120/78  Pulse: 72  Temp: 98.2 F (36.8 C)  Resp: 16   GEN: In NAD; WN,WD. HENT: [place]/AT; EOMI w/ clear conj/sclerae. Otherwise unremarkable. NECK: No TMG or LAN. Decreased ROM with posterior muscle tenderness. No bony tenderness.  COR: RRR. No edema. LUNGS: Unlabored resp. SKIN: W&D; intact w/o erythema, diaphoresis or jaundice. MS: MAEs; good ROM in shoulder joints but discomfort at extremes.  Supraspinatus muscle spasms. No muscle atrophy. NEURO: A&O x 3; CNs intact. Nonfocal.   Results for orders placed in visit on 12/23/13  POCT GLYCOSYLATED HEMOGLOBIN (HGB A1C)      Result Value Ref Range   Hemoglobin A1C 5.8      A/P: Hypothyroidism - Continue current dose of Levothyroxine 75 mcg pending lab results. Plan: Thyroid Panel With TSH  HTN, goal below 140/90 - Stabel on current medication  dose. Screen for DM. Plan: POCT glycosylated hemoglobin (Hb A1C), CBC with Differential  DDD (degenerative disc disease), cervical - Plan: Ambulatory referral to Physical Therapy  Meds ordered this encounter  Medications  . atorvastatin (LIPITOR) 20 MG tablet    Sig: Take 1 tablet at bedtime.    Dispense:  30 tablet    Refill:  6  . levothyroxine (SYNTHROID, LEVOTHROID) 75 MCG tablet    Sig: Take 1 tablet (75 mcg total) by mouth daily.    Dispense:  90 tablet    Refill:  2  . lisinopril (PRINIVIL,ZESTRIL) 20 MG tablet    Sig: Take 1/2 tablet daily to lower blood pressure.    Dispense:  50 tablet    Refill:  3

## 2013-12-25 ENCOUNTER — Other Ambulatory Visit: Payer: Self-pay | Admitting: Family Medicine

## 2013-12-25 DIAGNOSIS — E039 Hypothyroidism, unspecified: Secondary | ICD-10-CM

## 2013-12-25 NOTE — Progress Notes (Signed)
Quick Note:  Please advise pt regarding following labs... Complete blood counts are normal. Thyroid function tests indicate a slight dose reduction may be indicated. I am going to leave the dose as it is because you stated that current dose is good but a little higher would be better. Based on our conversation, I am leaving you thyroid medication where it is for now. It will need to be checked again in about 3 months. Be sure to take this medication by itself, separate from other medications.  I am going to place a future order for labs; you will come in just to have blood drawn in about 3 months. You will need to see me in 6 months.  Copy to pt. ______

## 2013-12-27 ENCOUNTER — Encounter: Payer: Self-pay | Admitting: *Deleted

## 2014-05-04 ENCOUNTER — Ambulatory Visit (INDEPENDENT_AMBULATORY_CARE_PROVIDER_SITE_OTHER): Payer: BC Managed Care – PPO | Admitting: Family Medicine

## 2014-05-04 ENCOUNTER — Encounter: Payer: Self-pay | Admitting: Family Medicine

## 2014-05-04 ENCOUNTER — Ambulatory Visit (INDEPENDENT_AMBULATORY_CARE_PROVIDER_SITE_OTHER): Payer: BC Managed Care – PPO

## 2014-05-04 VITALS — BP 122/76 | HR 91 | Temp 98.4°F | Resp 16 | Ht 62.75 in | Wt 164.8 lb

## 2014-05-04 DIAGNOSIS — M25512 Pain in left shoulder: Secondary | ICD-10-CM

## 2014-05-04 DIAGNOSIS — M25519 Pain in unspecified shoulder: Secondary | ICD-10-CM

## 2014-05-04 MED ORDER — TRAMADOL-ACETAMINOPHEN 37.5-325 MG PO TABS: 1.0000 | ORAL_TABLET | Freq: Three times a day (TID) | ORAL | Status: DC | PRN

## 2014-05-04 NOTE — Patient Instructions (Signed)
I am going to refer you to an ORTHOPEDIST who may be able to work you in on Friday or Monday.  I have prescribed some medication to reduce the pain. Our office will contact you about your appointment with the specialist.

## 2014-05-04 NOTE — Progress Notes (Signed)
S:  This 53 y.o. Female is here for evaluation of left shoulder pain. She has hx of C-spine DDD, treated w/ PT and home exercises. While in treatment at [facility]; therapist discovered decreased ROM in left shoulder. Therapy seemed to worsen pain. No relief w/ OTC NSAIDs. Pt recalls a fall about 4-5 months ago onto L shoulder and then a possible strain injury while lifting bags of mulch. She cannot sleep on L side and shoulder area is exteremely sensitive to touch. Pain seems to radiate into upper arm and forearm muscles. She denies numbness or paresthesias. She cannot lift anything more than 4-5 lbs with L hand.  Patient Active Problem List   Diagnosis Date Noted  . 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 HTN, goal below 140/90 01/27/2013  . Hypothyroidism 06/22/2012  . Hyperlipidemia LDL goal < 100 06/22/2012  . Cervical neck pain with evidence of disc disease 06/22/2012  . Obesity (BMI 30.0-34.9) 06/22/2012    Prior to Admission medications   Medication Sig Start Date End Date Taking? Authorizing Provider  atorvastatin (LIPITOR) 20 MG tablet Take 1 tablet at bedtime. 12/23/13  Yes Maurice MarchBarbara B Raquelle Pietro, MD  levothyroxine (SYNTHROID, LEVOTHROID) 75 MCG tablet Take 1 tablet (75 mcg total) by mouth daily. 12/23/13  Yes Maurice MarchBarbara B Rollande Thursby, MD  lisinopril (PRINIVIL,ZESTRIL) 20 MG tablet Take 1/2 tablet daily to lower blood pressure. 12/23/13  Yes Maurice MarchBarbara B Ryshawn Sanzone, MD   No Known Allergies  PMHx, Surg Hx, Soc and Fam Hx reviewed.  ROS; AS per HPI. Negative for fever/chills, fatigue, neck pain/stiffness, back pain, myalgias/arthralgias, HA, weakness, numbness or syncope.  O: Filed Vitals:   05/04/14 1524  BP: 122/76  Pulse: 91  Temp: 98.4 F (36.9 C)  Resp: 16   GEN: In NAD; WN,WD. HENT: [place]/AT; EOMI w/ clear conj/sclerae. Ext ears/nose and oroph unremarkable. NECK: Supple with minimal muscle spasms posteriorly. COR: RRR. No edema. Radial pulses intact (2+/=). LUNGS: Normal resp  effort. MS: Normal ROM in R shoulder. L shoulder- Rhomboid/posterior deltoid mildly tender w/ spasm; tender in axilla. Decreased ROM (abduction to 90 degrees, internal rotation). Speed's test weakly +; Yergason's test weakly +. Bicipital tendon nontender.  Neer's test+ evidence of impingement. Neurovascular intact. NEURO: A&O x 3; CNs intact. Motor function/strength: RUE-4+/5; LUE 3+/=. Nonfocal.   [facility] reading (PRIMARY) by  Dr. Audria NineMcPherson:  L shoulder- Normal joint space w/o fracture or dislocation. Very mild degenerative changes.   A/P: Left shoulder pain - Plan: DG Shoulder Left-  Pt leaving city on Tuesday (05/09/14) for extended vacation. Phone call placed to Dr. Barbaraann FasterNitka's office by Georg Ruddleynthia Joyce, CMA; they will see pt on Monday. Pt aware. Advised against strenuous activity or over-exertion. RX: Utracet 1 tablet every 8 hours prn pain.

## 2014-05-29 ENCOUNTER — Telehealth: Payer: Self-pay

## 2014-05-29 MED ORDER — ATORVASTATIN CALCIUM 20 MG PO TABS: ORAL_TABLET | ORAL | Status: DC

## 2014-05-29 NOTE — Telephone Encounter (Signed)
LM for pt- it is time for her follow up per Dr McPherson's lab note in March. Sent in 30 day supply.

## 2014-05-29 NOTE — Telephone Encounter (Signed)
Patient requesting a refill on her 'Liptor 20mg ". Please call in to [facility] on [address] Patients call back number is [phone]

## 2014-06-02 ENCOUNTER — Other Ambulatory Visit (INDEPENDENT_AMBULATORY_CARE_PROVIDER_SITE_OTHER): Payer: BC Managed Care – PPO | Admitting: *Deleted

## 2014-06-02 DIAGNOSIS — I10 Essential (primary) hypertension: Secondary | ICD-10-CM

## 2014-06-02 DIAGNOSIS — E039 Hypothyroidism, unspecified: Secondary | ICD-10-CM

## 2014-06-02 DIAGNOSIS — E785 Hyperlipidemia, unspecified: Secondary | ICD-10-CM

## 2014-06-02 LAB — THYROID PANEL WITH TSH
FREE THYROXINE INDEX: 3 (ref 1.0–3.9)
T3 Uptake: 34.8 % (ref 22.5–37.0)
T4, Total: 8.7 ug/dL (ref 5.0–12.5)
TSH: 1.332 u[IU]/mL (ref 0.350–4.500)

## 2014-06-02 LAB — COMPLETE METABOLIC PANEL WITH GFR
ALT: 28 U/L (ref 0–35)
AST: 24 U/L (ref 0–37)
Albumin: 4.1 g/dL (ref 3.5–5.2)
Alkaline Phosphatase: 57 U/L (ref 39–117)
BILIRUBIN TOTAL: 0.5 mg/dL (ref 0.2–1.2)
BUN: 18 mg/dL (ref 6–23)
CALCIUM: 9.1 mg/dL (ref 8.4–10.5)
CHLORIDE: 104 meq/L (ref 96–112)
CO2: 29 meq/L (ref 19–32)
Creat: 0.65 mg/dL (ref 0.50–1.10)
GFR, Est African American: >89 mL/min
GLUCOSE: 85 mg/dL (ref 70–99)
Potassium: 4.3 mEq/L (ref 3.5–5.3)
SODIUM: 141 meq/L (ref 135–145)
Total Protein: 6.6 g/dL (ref 6.0–8.3)

## 2014-06-02 LAB — LIPID PANEL
CHOL/HDL RATIO: 3.4 ratio
Cholesterol: 152 mg/dL (ref 0–200)
HDL: 45 mg/dL (ref >39)
LDL CALC: 79 mg/dL (ref 0–99)
Triglycerides: 140 mg/dL (ref <150)
VLDL: 28 mg/dL (ref 0–40)

## 2014-06-02 NOTE — Progress Notes (Signed)
Pt her for lab draw

## 2014-07-05 ENCOUNTER — Encounter: Payer: Self-pay | Admitting: Family Medicine

## 2014-07-06 ENCOUNTER — Ambulatory Visit: Payer: BC Managed Care – PPO | Admitting: Family Medicine

## 2014-07-20 ENCOUNTER — Ambulatory Visit: Payer: BC Managed Care – PPO | Admitting: Family Medicine

## 2014-07-21 ENCOUNTER — Ambulatory Visit (INDEPENDENT_AMBULATORY_CARE_PROVIDER_SITE_OTHER): Payer: BC Managed Care – PPO | Admitting: Family Medicine

## 2014-07-21 ENCOUNTER — Encounter: Payer: Self-pay | Admitting: Family Medicine

## 2014-07-21 VITALS — BP 124/78 | HR 97 | Temp 98.7°F | Resp 16 | Ht 64.5 in | Wt 167.0 lb

## 2014-07-21 DIAGNOSIS — E039 Hypothyroidism, unspecified: Secondary | ICD-10-CM

## 2014-07-21 DIAGNOSIS — I10 Essential (primary) hypertension: Secondary | ICD-10-CM

## 2014-07-21 DIAGNOSIS — E785 Hyperlipidemia, unspecified: Secondary | ICD-10-CM

## 2014-07-21 MED ORDER — LEVOTHYROXINE SODIUM 75 MCG PO TABS: 75.0000 ug | ORAL_TABLET | Freq: Every day | ORAL | Status: DC

## 2014-07-21 MED ORDER — ATORVASTATIN CALCIUM 20 MG PO TABS: ORAL_TABLET | ORAL | Status: DC

## 2014-07-21 MED ORDER — LISINOPRIL 20 MG PO TABS: ORAL_TABLET | ORAL | Status: DC

## 2014-07-21 NOTE — Progress Notes (Signed)
I have reviewed documentation per T. Brewington, PA-C, interviewed pt and discussed A/P with PA-C. Agree with documentation; nothing to add.  Pt will RTC in Dec 2015 for CPE. No labs at that time. Refill all meds at this visit x 1 year.

## 2014-07-21 NOTE — Progress Notes (Signed)
Subjective:    Patient ID: Alice Anderson, female    DOB: [birth year], 53 y.o.   MRN: [id number]  HPI Patient presents for medication refill. Has missed two dose of atorvastatin, but has been compliant with all other medication. Denies medication side effects. Is a vegetarian and reports eating healthy. Drinks 5 8 oz glasses of water, 1 cup of coffee, and 1 cup of tea per day.  Is no longer having physical therapy and says her shoulder is much improved. She is now able to raise arm.  Has seasonal allergies and has intermittent congestion this time of year. Zyrtec during the day and Benadryl at night improves sx.   Health maintenance: [facility]    Review of Systems  Constitutional: Negative for fever, activity change and fatigue.  HENT: Positive for congestion. Negative for ear discharge, ear pain, hearing loss, rhinorrhea, sinus pressure and sore throat.   Eyes: Negative for discharge, itching and visual disturbance.  Respiratory: Negative for cough, chest tightness and shortness of breath.   Cardiovascular: Negative for chest pain, palpitations and leg swelling.  Gastrointestinal: Negative for nausea, vomiting, diarrhea and constipation.  Endocrine: Negative.   Genitourinary: Negative.   Musculoskeletal: Negative for arthralgias (shoulder improved), myalgias, neck pain and neck stiffness.  Allergic/Immunologic: Positive for environmental allergies.  Neurological: Negative for dizziness, light-headedness and headaches.       Objective:   Physical Exam  Constitutional: She is oriented to person, place, and time. She appears well-developed and well-nourished. No distress.  HENT:  Head: Normocephalic and atraumatic.  Right Ear: External ear normal.  Left Ear: External ear normal.  Nose: Nose normal.  Mouth/Throat: Oropharynx is clear and moist. No oropharyngeal exudate.  Eyes: Conjunctivae are normal. Right eye exhibits no discharge. Left eye exhibits no  discharge.  Neck: Neck supple. No JVD present. No thyromegaly present.  Cardiovascular: Normal rate, regular rhythm, normal heart sounds and intact distal pulses.  Exam reveals no gallop and no friction rub.   No murmur heard. Pulmonary/Chest: Effort normal and breath sounds normal. No stridor. No respiratory distress. She has no wheezes. She has no rales.  Abdominal: Soft. Bowel sounds are normal. She exhibits no distension and no mass. There is no tenderness.  Musculoskeletal: Normal range of motion. She exhibits no edema and no tenderness.  Lymphadenopathy:    She has no cervical adenopathy.  Neurological: She is oriented to person, place, and time.  Skin: Skin is warm and dry. No rash noted. She is not diaphoretic. No erythema.  Psychiatric: She has a normal mood and affect. Her behavior is normal. Judgment and thought content normal.   Blood pressure 124/78, pulse 97, temperature 98.7 F (37.1 C), temperature source Oral, resp. rate 16, height 5' 4.5" (1.638 m), weight 167 lb (75.751 kg), SpO2 97.00%.  Results for orders placed in visit on 06/02/14  COMPLETE METABOLIC PANEL WITH GFR      Result Value Ref Range   Sodium 141  135 - 145 mEq/L   Potassium 4.3  3.5 - 5.3 mEq/L   Chloride 104  96 - 112 mEq/L   CO2 29  19 - 32 mEq/L   Glucose, Bld 85  70 - 99 mg/dL   BUN 18  6 - 23 mg/dL   Creat 3.150.65  4.000.50 - 8.671.10 mg/dL   Total Bilirubin 0.5  0.2 - 1.2 mg/dL   Alkaline Phosphatase 57  39 - 117 U/L   AST 24  0 - 37  U/L   ALT 28  0 - 35 U/L   Total Protein 6.6  6.0 - 8.3 g/dL   Albumin 4.1  3.5 - 5.2 g/dL   Calcium 9.1  8.4 - 16.110.5 mg/dL   GFR, Est African American >89     GFR, Est Non African American >89    THYROID PANEL WITH TSH      Result Value Ref Range   T4, Total 8.7  5.0 - 12.5 ug/dL   T3 Uptake 09.634.8  04.522.5 - 37.0 %   Free Thyroxine Index 3.0  1.0 - 3.9   TSH 1.332  0.350 - 4.500 uIU/mL  LIPID PANEL      Result Value Ref Range   Cholesterol 152  0 - 200 mg/dL    Triglycerides 409140  <811<150 mg/dL   HDL 45  >91>39 mg/dL   Total CHOL/HDL Ratio 3.4     VLDL 28  0 - 40 mg/dL   LDL Cholesterol 79  0 - 99 mg/dL       Assessment & Plan:  1. HTN, goal below 140/90 Well controlled with medication. - lisinopril (PRINIVIL,ZESTRIL) 20 MG tablet; Take 1/2 tablet daily to lower blood pressure.  Dispense: 50 tablet; Refill: 3  2. Hyperlipidemia with target LDL less than 100 Last labs within range.  - atorvastatin (LIPITOR) 20 MG tablet; Take 1 tablet at bedtime.  Dispense: 30 tablet; Refill: 11 3. Hypothyroidism, unspecified hypothyroidism type Last labs within range. - levothyroxine (SYNTHROID, LEVOTHROID) 75 MCG tablet; Take 1 tablet (75 mcg total) by mouth daily.  Dispense: 90 tablet; Refill: 3   Nicklos Gaxiola PA-C  [facility] 07/21/2014 3:38 PM

## 2014-09-26 ENCOUNTER — Encounter: Payer: Self-pay | Admitting: Family Medicine

## 2014-10-03 ENCOUNTER — Encounter: Payer: BC Managed Care – PPO | Admitting: Family Medicine

## 2014-11-10 ENCOUNTER — Ambulatory Visit (INDEPENDENT_AMBULATORY_CARE_PROVIDER_SITE_OTHER): Payer: BC Managed Care – PPO | Admitting: Family Medicine

## 2014-11-10 ENCOUNTER — Encounter: Payer: Self-pay | Admitting: Family Medicine

## 2014-11-10 VITALS — BP 114/76 | HR 86 | Temp 98.3°F | Resp 16 | Ht 63.0 in | Wt 167.8 lb

## 2014-11-10 DIAGNOSIS — Z01419 Encounter for gynecological examination (general) (routine) without abnormal findings: Secondary | ICD-10-CM

## 2014-11-10 DIAGNOSIS — I10 Essential (primary) hypertension: Secondary | ICD-10-CM

## 2014-11-10 DIAGNOSIS — Z1211 Encounter for screening for malignant neoplasm of colon: Secondary | ICD-10-CM

## 2014-11-10 DIAGNOSIS — Z0000 Encounter for general adult medical examination without abnormal findings: Secondary | ICD-10-CM

## 2014-11-10 DIAGNOSIS — Z124 Encounter for screening for malignant neoplasm of cervix: Secondary | ICD-10-CM

## 2014-11-10 LAB — POCT URINALYSIS DIPSTICK
BILIRUBIN UA: NEGATIVE
Glucose, UA: NEGATIVE
KETONES UA: NEGATIVE
Leukocytes, UA: NEGATIVE
Nitrite, UA: NEGATIVE
PROTEIN UA: NEGATIVE
Spec Grav, UA: 1.015
Urobilinogen, UA: 0.2
pH, UA: 5

## 2014-11-10 LAB — POCT GLYCOSYLATED HEMOGLOBIN (HGB A1C): HEMOGLOBIN A1C: 5.8

## 2014-11-10 NOTE — Progress Notes (Signed)
Subjective:    Patient ID: Alice Anderson, female    DOB: January 13, 1961, 54 y.o.   MRN: [id number]  HPI  This 54 y.o. Female is here for CPE/PAP. She has well controlled HTN, hypothyroidism and lipid disorder. She is compliant w/ all medications w/o adverse effects.  HCM: MMG- Current.           PAP- Jan 2015 (performed elsewhere).           CRS- Needs to be scheduled.           IMM- Current.           Vision- Current.           Dental- Current.  Patient Active Problem List   Diagnosis Date Noted  . 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 HTN, goal below 140/90 01/27/2013  . Hypothyroidism 06/22/2012  . Hyperlipidemia with target LDL less than 100 06/22/2012  . Cervical neck pain with evidence of disc disease 06/22/2012  . Obesity (BMI 30.0-34.9) 06/22/2012    Prior to Admission medications   Medication Sig Start Date End Date Taking? Authorizing Provider  atorvastatin (LIPITOR) 20 MG tablet Take 1 tablet at bedtime. 07/21/14  Yes Tishira R Brewington, PA-C  cetirizine (ZYRTEC) 10 MG tablet Take 10 mg by mouth daily.   Yes Historical Provider, MD  levothyroxine (SYNTHROID, LEVOTHROID) 75 MCG tablet Take 1 tablet (75 mcg total) by mouth daily. 07/21/14  Yes Tishira R Brewington, PA-C  lisinopril (PRINIVIL,ZESTRIL) 20 MG tablet Take 1/2 tablet daily to lower blood pressure. 07/21/14  Yes Tishira R Brewington, PA-C  traMADol-acetaminophen (ULTRACET) 37.5-325 MG per tablet Take 1 tablet by mouth every 8 (eight) hours as needed. Patient not taking: Reported on 11/10/2014 05/04/14   Maurice March, MD    Past Surgical History  Procedure Laterality Date  . Cesarean section  1998 and 1999    History   Social History  . Marital Status: Married    Spouse Name: N/A    Number of Children: N/A  . Years of Education: college   Occupational History  . teacher    Social History Main Topics  . Smoking status: Never Smoker   . Smokeless tobacco: Never Used  . Alcohol Use: No  . Drug  Use: No  . Sexual Activity: Yes    Birth Control/ Protection: Condom   Other Topics Concern  . Not on file   Social History Narrative    Family History  Problem Relation Age of Onset  . Hypothyroidism Mother 63  . Heart disease Mother 72    chf  . Heart disease Father 6    heart failure  . Diabetes Sister   . Hypertension Sister   . Thyroid disease Sister   . Hypertension Brother      Review of Systems  Constitutional: Negative.   HENT: Positive for sinus pressure.   Eyes: Negative.   Respiratory: Negative.   Cardiovascular: Negative.   Gastrointestinal: Negative.   Endocrine: Negative.   Genitourinary: Negative.   Musculoskeletal: Negative.   Skin: Negative.   Allergic/Immunologic: Negative.   Neurological: Negative.   Hematological: Negative.   Psychiatric/Behavioral: Negative.       Objective:   Physical Exam  Constitutional: She is oriented to person, place, and time. Vital signs are normal. She appears well-developed and well-nourished. No distress.  Blood pressure 114/76, pulse 86, temperature 98.3 F (36.8 C), temperature source Oral, resp. rate 16, height  (1.6 m), weight 167 lb 12.8 oz (76.114  kg), SpO2 97 %.   HENT:  Head: Normocephalic and atraumatic.  Right Ear: Hearing, tympanic membrane, external ear and ear canal normal.  Left Ear: Hearing, tympanic membrane, external ear and ear canal normal.  Nose: Mucosal edema present. No rhinorrhea, nasal deformity or septal deviation.  Mouth/Throat: Uvula is midline. No oral lesions. Normal dentition. No dental caries. Posterior oropharyngeal erythema present. No oropharyngeal exudate or posterior oropharyngeal edema.  Eyes: EOM and lids are normal. No scleral icterus.  Fundoscopic exam:      The right eye shows red reflex.       The left eye shows red reflex.  Neck: Trachea normal, full passive range of motion without pain and phonation normal. Neck supple. No spinous process tenderness and no  muscular tenderness present. Decreased range of motion present. No thyroid mass and no thyromegaly present.  Cardiovascular: Normal rate, regular rhythm, S1 normal, S2 normal, normal heart sounds, intact distal pulses and normal pulses.   No extrasystoles are present. PMI is not displaced.  Exam reveals no gallop and no friction rub.   No murmur heard. Pulmonary/Chest: Effort normal and breath sounds normal. No respiratory distress. Right breast exhibits no inverted nipple, no mass, no nipple discharge, no skin change and no tenderness. Left breast exhibits no inverted nipple, no mass, no nipple discharge, no skin change and no tenderness. Breasts are symmetrical.  Abdominal: Soft. Normal appearance and bowel sounds are normal. She exhibits no distension and no pulsatile midline mass. There is no hepatosplenomegaly. There is no tenderness. There is no guarding and no CVA tenderness.  Genitourinary: Rectum normal and vagina normal. There is no rash, tenderness or lesion on the right labia. There is no rash, tenderness or lesion on the left labia. Uterus is deviated. Cervix exhibits discharge. Cervix exhibits no motion tenderness and no friability. Right adnexum displays no mass, no tenderness and no fullness. Left adnexum displays no mass, no tenderness and no fullness.  Uterus tilted to right.  Musculoskeletal:       Cervical back: She exhibits decreased range of motion, tenderness and spasm. She exhibits no bony tenderness, no deformity and no pain.       Thoracic back: Normal.       Lumbar back: Normal.  Remainder of exam unremarkable.  Lymphadenopathy:       Head (right side): No submental, no submandibular, no tonsillar, no preauricular, no posterior auricular and no occipital adenopathy present.       Head (left side): No submental, no submandibular, no tonsillar, no preauricular, no posterior auricular and no occipital adenopathy present.    She has no cervical adenopathy.    She has no  axillary adenopathy.       Right: No inguinal and no supraclavicular adenopathy present.       Left: No inguinal and no supraclavicular adenopathy present.  Neurological: She is alert and oriented to person, place, and time. She has normal strength and normal reflexes. She displays no atrophy. No cranial nerve deficit or sensory deficit. She exhibits normal muscle tone. Coordination and gait normal.  Skin: Skin is warm, dry and intact. No ecchymosis, no lesion and no rash noted. She is not diaphoretic. No cyanosis or erythema. Nails show no clubbing.  Psychiatric: She has a normal mood and affect. Her speech is normal and behavior is normal. Judgment and thought content normal. Cognition and memory are normal.  Nursing note and vitals reviewed.   Results for orders placed or performed in visit on 11/10/14  POCT glycosylated hemoglobin (Hb A1C)  Result Value Ref Range   Hemoglobin A1C 5.8   POCT urinalysis dipstick  Result Value Ref Range   Color, UA yellow    Clarity, UA clear    Glucose, UA neg    Bilirubin, UA neg    Ketones, UA neg    Spec Grav, UA 1.015    Blood, UA trace-intact    pH, UA 5.0    Protein, UA neg    Urobilinogen, UA 0.2    Nitrite, UA neg    Leukocytes, UA Negative        Assessment & Plan:  Well woman exam - Encouraged pt to focus on mindful nutrition and increased physical activity; she has family hx of Diabetes and heart disease. Plan: POCT glycosylated hemoglobin (Hb A1C), POCT urinalysis dipstick  Encounter for cervical Pap smear with pelvic exam - Plan: Pap IG (Image Guided)  HTN, goal below 140/90- Stable and well controlled.  Screen for colon cancer - Plan: Ambulatory referral to Gastroenterology, IFOBT POC (occult bld, rslt in office)

## 2014-11-10 NOTE — Patient Instructions (Signed)
Keeping You Healthy  Get These Tests  Blood Pressure- Have your blood pressure checked by your healthcare provider at least once a year.  Normal blood pressure is 120/80.  Weight- Have your body mass index (BMI) calculated to screen for obesity.  BMI is a measure of body fat based on height and weight.  You can calculate your own BMI at www.nhlbisupport.com/bmi/  Cholesterol- Have your cholesterol checked every year.  Diabetes- Have your blood sugar checked every year if you have high blood pressure, high cholesterol, a family history of diabetes or if you are overweight.  Pap Smear- Have a pap smear every 1 to 3 years if you have been sexually active.  If you are older than 65 and recent pap smears have been normal you may not need additional pap smears.  In addition, if you have had a hysterectomy  For benign disease additional pap smears are not necessary.  Mammogram-Yearly mammograms are essential for early detection of breast cancer  Screening for Colon Cancer- Colonoscopy starting at age 50. Screening may begin sooner depending on your family history and other health conditions.  Follow up colonoscopy as directed by your Gastroenterologist.  Screening for Osteoporosis- Screening begins at age 65 with bone density scanning, sooner if you are at higher risk for developing Osteoporosis.  Get these medicines  Calcium with Vitamin D- Your body requires 1200-1500 mg of Calcium a day and 800-1000 IU of Vitamin D a day.  You can only absorb 500 mg of Calcium at a time therefore Calcium must be taken in 2 or 3 separate doses throughout the day.  Hormones- Hormone therapy has been associated with increased risk for certain cancers and heart disease.  Talk to your healthcare provider about if you need relief from menopausal symptoms.  Aspirin- Ask your healthcare provider about taking Aspirin to prevent Heart Disease and Stroke.  Get these Immuniztions  Flu shot- Every fall  Pneumonia  shot- Once after the age of 65; if you are younger ask your healthcare provider if you need a pneumonia shot.  Tetanus- Every ten years.  Zostavax- Once after the age of 60 to prevent shingles.  Take these steps  Don't smoke- Your healthcare provider can help you quit. For tips on how to quit, ask your healthcare provider or go to www.smokefree.gov or call 1-800 QUIT-NOW.  Be physically active- Exercise 5 days a week for a minimum of 30 minutes.  If you are not already physically active, start slow and gradually work up to 30 minutes of moderate physical activity.  Try walking, dancing, bike riding, swimming, etc.  Eat a healthy diet- Eat a variety of healthy foods such as fruits, vegetables, whole grains, low fat milk, low fat cheeses, yogurt, lean meats, chicken, fish, eggs, dried beans, tofu, etc.  For more information go to www.thenutritionsource.org  Dental visit- Brush and floss teeth twice daily; visit your dentist twice a year.  Eye exam- Visit your Optometrist or Ophthalmologist yearly.  Drink alcohol in moderation- Limit alcohol intake to one drink or less a day.  Never drink and drive.  Depression- Your emotional health is as important as your physical health.  If you're feeling down or losing interest in things you normally enjoy, please talk to your healthcare provider.  Seat Belts- can save your life; always wear one  Smoke/Carbon Monoxide detectors- These detectors need to be installed on the appropriate level of your home.  Replace batteries at least once a year.  Violence- If anyone   is threatening or hurting you, please tell your healthcare provider.  Living Will/ Health care power of attorney- Discuss with your healthcare provider and family.    DASH Eating Plan DASH stands for "Dietary Approaches to Stop Hypertension." The DASH eating plan is a healthy eating plan that has been shown to reduce high blood pressure (hypertension). Additional health benefits may  include reducing the risk of type 2 diabetes mellitus, heart disease, and stroke. The DASH eating plan may also help with weight loss. WHAT DO I NEED TO KNOW ABOUT THE DASH EATING PLAN? For the DASH eating plan, you will follow these general guidelines:  Choose foods with a percent daily value for sodium of less than 5% (as listed on the food label).  Use salt-free seasonings or herbs instead of table salt or sea salt.  Check with your health care provider or pharmacist before using salt substitutes.  Eat lower-sodium products, often labeled as "lower sodium" or "no salt added."  Eat fresh foods.  Eat more vegetables, fruits, and low-fat dairy products.  Choose whole grains. Look for the word "whole" as the first word in the ingredient list.  Choose fish and skinless chicken or [place]urkey more often than red meat. Limit fish, poultry, and meat to 6 oz (170 g) each day.  Limit sweets, desserts, sugars, and sugary drinks.  Choose heart-healthy fats.  Limit cheese to 1 oz (28 g) per day.  Eat more home-cooked food and less restaurant, buffet, and fast food.  Limit fried foods.  Cook foods using methods other than frying.  Limit canned vegetables. If you do use them, rinse them well to decrease the sodium.  When eating at a restaurant, ask that your food be prepared with less salt, or no salt if possible. WHAT FOODS CAN I EAT? Seek help from a dietitian for individual calorie needs. Grains Whole grain or whole wheat bread. Brown rice. Whole grain or whole wheat pasta. Quinoa, bulgur, and whole grain cereals. Low-sodium cereals. Corn or whole wheat flour tortillas. Whole grain cornbread. Whole grain crackers. Low-sodium crackers. Vegetables Fresh or frozen vegetables (raw, steamed, roasted, or grilled). Low-sodium or reduced-sodium tomato and vegetable juices. Low-sodium or reduced-sodium tomato sauce and paste. Low-sodium or reduced-sodium canned vegetables.  Fruits All fresh,  canned (in natural juice), or frozen fruits. Meat and Other Protein Products Ground beef (85% or leaner), grass-fed beef, or beef trimmed of fat. Skinless chicken or [place]key. Ground chicken or [place]urkey. Pork trimmed of fat. All fish and seafood. Eggs. Dried beans, peas, or lentils. Unsalted nuts and seeds. Unsalted canned beans. Dairy Low-fat dairy products, such as skim or 1% milk, 2% or reduced-fat cheeses, low-fat ricotta or cottage cheese, or plain low-fat yogurt. Low-sodium or reduced-sodium cheeses. Fats and Oils Tub margarines without trans fats. Light or reduced-fat mayonnaise and salad dressings (reduced sodium). Avocado. Safflower, olive, or canola oils. Natural peanut or almond butter. Other Unsalted popcorn and pretzels. The items listed above may not be a complete list of recommended foods or beverages. Contact your dietitian for more options. WHAT FOODS ARE NOT RECOMMENDED? Grains White bread. White pasta. White rice. Refined cornbread. Bagels and croissants. Crackers that contain trans fat. Vegetables Creamed or fried vegetables. Vegetables in a cheese sauce. Regular canned vegetables. Regular canned tomato sauce and paste. Regular tomato and vegetable juices. Fruits Dried fruits. Canned fruit in light or heavy syrup. Fruit juice. Meat and Other Protein Products Fatty cuts of meat. Ribs, chicken wings, bacon, sausage, bologna, salami, chitterlings, fatback, hot  dogs, bratwurst, and packaged luncheon meats. Salted nuts and seeds. Canned beans with salt. Dairy Whole or 2% milk, cream, half-and-half, and cream cheese. Whole-fat or sweetened yogurt. Full-fat cheeses or blue cheese. Nondairy creamers and whipped toppings. Processed cheese, cheese spreads, or cheese curds. Condiments Onion and garlic salt, seasoned salt, table salt, and sea salt. Canned and packaged gravies. Worcestershire sauce. Tartar sauce. Barbecue sauce. Teriyaki sauce. Soy sauce, including reduced sodium. Steak  sauce. Fish sauce. Oyster sauce. Cocktail sauce. Horseradish. Ketchup and mustard. Meat flavorings and tenderizers. Bouillon cubes. Hot sauce. Tabasco sauce. Marinades. Taco seasonings. Relishes. Fats and Oils Butter, stick margarine, lard, shortening, ghee, and bacon fat. Coconut, palm kernel, or palm oils. Regular salad dressings. Other Pickles and olives. Salted popcorn and pretzels. The items listed above may not be a complete list of foods and beverages to avoid. Contact your dietitian for more information. WHERE CAN I FIND MORE INFORMATION? National Heart, Lung, and Blood Institute: CablePromo.it Document Released: 09/18/2011 Document Revised: 02/13/2014 Document Reviewed: 08/03/2013 [facility] Patient Information 2015 [place], [place]. This information is not intended to replace advice given to you by your health care provider. Make sure you discuss any questions you have with your health care provider.    Exercise to Lose Weight Exercise and a healthy diet may help you lose weight. Your doctor may suggest specific exercises. EXERCISE IDEAS AND TIPS  Choose low-cost things you enjoy doing, such as walking, bicycling, or exercising to workout videos.  Take stairs instead of the elevator.  Walk during your lunch break.  Park your car further away from work or school.  Go to a gym or an exercise class.  Start with 5 to 10 minutes of exercise each day. Build up to 30 minutes of exercise 4 to 6 days a week.  Wear shoes with good support and comfortable clothes.  Stretch before and after working out.  Work out until you breathe harder and your heart beats faster.  Drink extra water when you exercise.  Do not do so much that you hurt yourself, feel dizzy, or get very short of breath. Exercises that burn about 150 calories:  Running 1  miles in 15 minutes.  Playing volleyball for 45 to 60 minutes.  Washing and waxing a car for 45 to 60  minutes.  Playing touch football for 45 minutes.  Walking 1  miles in 35 minutes.  Pushing a stroller 1  miles in 30 minutes.  Playing basketball for 30 minutes.  Raking leaves for 30 minutes.  Bicycling 5 miles in 30 minutes.  Walking 2 miles in 30 minutes.  Dancing for 30 minutes.  Shoveling snow for 15 minutes.  Swimming laps for 20 minutes.  Walking up stairs for 15 minutes.  Bicycling 4 miles in 15 minutes.  Gardening for 30 to 45 minutes.  Jumping rope for 15 minutes.  Washing windows or floors for 45 to 60 minutes. Document Released: 11/01/2010 Document Revised: 12/22/2011 Document Reviewed: 11/01/2010 [facility] Patient Information 2015 [place], [place]. This information is not intended to replace advice given to you by your health care provider. Make sure you discuss any questions you have with your health care provider.

## 2014-11-12 ENCOUNTER — Encounter: Payer: Self-pay | Admitting: Family Medicine

## 2014-11-13 LAB — PAP IG (IMAGE GUIDED)

## 2014-12-12 ENCOUNTER — Encounter: Payer: Self-pay | Admitting: Internal Medicine

## 2015-03-26 ENCOUNTER — Ambulatory Visit (AMBULATORY_SURGERY_CENTER): Payer: Self-pay | Admitting: *Deleted

## 2015-03-26 VITALS — Ht 62.0 in | Wt 166.0 lb

## 2015-03-26 DIAGNOSIS — Z1211 Encounter for screening for malignant neoplasm of colon: Secondary | ICD-10-CM

## 2015-03-26 LAB — IFOBT (OCCULT BLOOD): IMMUNOLOGICAL FECAL OCCULT BLOOD TEST: NEGATIVE

## 2015-03-26 MED ORDER — NA SULFATE-K SULFATE-MG SULF 17.5-3.13-1.6 GM/177ML PO SOLN: 1.0000 | Freq: Once | ORAL | Status: DC

## 2015-03-26 NOTE — Progress Notes (Signed)
No egg or soy allergy No issues with past sedation No diet pills No home 02 emmi video to e mail  

## 2015-03-27 ENCOUNTER — Telehealth: Payer: Self-pay | Admitting: *Deleted

## 2015-03-27 ENCOUNTER — Telehealth: Payer: Self-pay | Admitting: Internal Medicine

## 2015-03-27 NOTE — Telephone Encounter (Signed)
Pt states she was reading her instructions from PV yesterday and she has many issues about propofol. She doesn't think she needs propofol for her colon. Instructed her we use propofol for all procedures unless there is an egg/soy allergy. She states again she doesn't take a lot of meds and she thinks moderate sedation would be best. I explained the difference between both, after effects of both. She asked if someone that provides the sedation can call her. John, can you please call this lady and talk to her. She wants you to call her Wednesday 03-28-2015.  Hilda Lias PV

## 2015-03-27 NOTE — Telephone Encounter (Signed)
Pt states she was reading her instructions from PV yesterday and she has many issues about propofol. She doesn't think she needs propofol for her colon. Instructed her we use propofol for all procedures unless there is an egg/soy allergy. She states again she doesn't take a lot of meds and she thinks moderate sedation would be best. I explained the difference between both, after effects of both. She asked if someone that provides the sedation can call her. John, can you please call this lady and talk to her. She wants you to call her Wednesday 03-28-2015.  Marie PV  

## 2015-03-27 NOTE — Telephone Encounter (Signed)
Alice Anderson,  Got it Will call this nice lady.  Thanks a bunch.  Alice Anderson

## 2015-03-28 NOTE — Telephone Encounter (Signed)
Alice Anderson,  I just spoke with this pt, educated her on the anesthesia, allayed her concerns, and answered all questions.  She expressed understanding; thanking me and agreed to proceed with propofol sedation.    I am very grateful for your assistance in this matter.  Best regards,  Jonny Ruiz

## 2015-04-02 ENCOUNTER — Encounter: Payer: BC Managed Care – PPO | Admitting: Internal Medicine

## 2015-05-21 ENCOUNTER — Encounter: Payer: Self-pay | Admitting: Family Medicine

## 2015-05-22 NOTE — Telephone Encounter (Signed)
Denny Peon, can you please copy and paste the message I sent you to this documentation? Please place it in the clinical message pool. I could not do that by replying to you and I can't see the text I sent you anymore. Thank you for your help!

## 2015-05-24 ENCOUNTER — Telehealth: Payer: Self-pay

## 2015-05-24 DIAGNOSIS — E039 Hypothyroidism, unspecified: Secondary | ICD-10-CM

## 2015-05-24 DIAGNOSIS — Z130 Encounter for screening for diseases of the blood and blood-forming organs and certain disorders involving the immune mechanism: Secondary | ICD-10-CM

## 2015-05-24 DIAGNOSIS — I10 Essential (primary) hypertension: Secondary | ICD-10-CM

## 2015-05-24 DIAGNOSIS — E785 Hyperlipidemia, unspecified: Secondary | ICD-10-CM

## 2015-05-24 NOTE — Telephone Encounter (Signed)
Pt is scheduled with dr copland on 08/08/15 and is stating that she needs to put in a order for lab work before then so she can come in and have done before then  Pt is previous pt of dr Audria Nine

## 2015-05-29 ENCOUNTER — Telehealth: Payer: Self-pay | Admitting: Internal Medicine

## 2015-05-29 ENCOUNTER — Encounter: Payer: BC Managed Care – PPO | Admitting: Internal Medicine

## 2015-06-11 NOTE — Telephone Encounter (Signed)
Lady Gary,  Can you please bill this patient for no show?  Thanks

## 2015-06-11 NOTE — Telephone Encounter (Signed)
Dr. Rhea Belton,  Do you wish for this patient to be charged a no show fee?

## 2015-06-11 NOTE — Telephone Encounter (Signed)
yes

## 2015-07-14 ENCOUNTER — Other Ambulatory Visit (INDEPENDENT_AMBULATORY_CARE_PROVIDER_SITE_OTHER): Payer: BC Managed Care – PPO

## 2015-07-14 DIAGNOSIS — E039 Hypothyroidism, unspecified: Secondary | ICD-10-CM

## 2015-07-14 DIAGNOSIS — I10 Essential (primary) hypertension: Secondary | ICD-10-CM

## 2015-07-14 DIAGNOSIS — Z130 Encounter for screening for diseases of the blood and blood-forming organs and certain disorders involving the immune mechanism: Secondary | ICD-10-CM

## 2015-07-14 DIAGNOSIS — E785 Hyperlipidemia, unspecified: Secondary | ICD-10-CM

## 2015-07-14 LAB — CBC
HEMATOCRIT: 39.2 % (ref 36.0–46.0)
Hemoglobin: 13.4 g/dL (ref 12.0–15.0)
MCH: 28.2 pg (ref 26.0–34.0)
MCHC: 34.2 g/dL (ref 30.0–36.0)
MCV: 82.4 fL (ref 78.0–100.0)
MPV: 8.8 fL (ref 8.6–12.4)
PLATELETS: 348 10*3/uL (ref 150–400)
RBC: 4.76 MIL/uL (ref 3.87–5.11)
RDW: 13.1 % (ref 11.5–15.5)
WBC: 5.4 10*3/uL (ref 4.0–10.5)

## 2015-07-14 LAB — LIPID PANEL
CHOL/HDL RATIO: 4 ratio (ref <=5.0)
Cholesterol: 145 mg/dL (ref 125–200)
HDL: 36 mg/dL — AB (ref >=46)
LDL CALC: 80 mg/dL (ref <130)
TRIGLYCERIDES: 145 mg/dL (ref <150)
VLDL: 29 mg/dL (ref <30)

## 2015-07-14 LAB — COMPREHENSIVE METABOLIC PANEL
ALT: 36 U/L — ABNORMAL HIGH (ref 6–29)
AST: 30 U/L (ref 10–35)
Albumin: 4.2 g/dL (ref 3.6–5.1)
Alkaline Phosphatase: 70 U/L (ref 33–130)
BILIRUBIN TOTAL: 0.5 mg/dL (ref 0.2–1.2)
BUN: 10 mg/dL (ref 7–25)
CALCIUM: 9.3 mg/dL (ref 8.6–10.4)
CHLORIDE: 104 mmol/L (ref 98–110)
CO2: 24 mmol/L (ref 20–31)
Creat: 0.62 mg/dL (ref 0.50–1.05)
GLUCOSE: 102 mg/dL — AB (ref 65–99)
Potassium: 4.5 mmol/L (ref 3.5–5.3)
Sodium: 138 mmol/L (ref 135–146)
Total Protein: 7 g/dL (ref 6.1–8.1)

## 2015-07-14 LAB — TSH: TSH: 0.475 u[IU]/mL (ref 0.350–4.500)

## 2015-07-23 ENCOUNTER — Other Ambulatory Visit: Payer: Self-pay

## 2015-07-23 DIAGNOSIS — E039 Hypothyroidism, unspecified: Secondary | ICD-10-CM

## 2015-07-23 DIAGNOSIS — I10 Essential (primary) hypertension: Secondary | ICD-10-CM

## 2015-07-23 NOTE — Telephone Encounter (Signed)
Pt has scheduled appt on Oct 26,but will run out of meds before that,please Refill her Lisininopril,Levorthorax?"   Best phone [phone]   Pharmacy [facility] w [facility]

## 2015-07-24 MED ORDER — LISINOPRIL 20 MG PO TABS: ORAL_TABLET | ORAL | Status: DC

## 2015-07-24 MED ORDER — LEVOTHYROXINE SODIUM 75 MCG PO TABS: 75.0000 ug | ORAL_TABLET | Freq: Every day | ORAL | Status: DC

## 2015-07-24 NOTE — Telephone Encounter (Signed)
Sent in 1 mos RFs and notified pt on VM.

## 2015-08-08 ENCOUNTER — Ambulatory Visit (INDEPENDENT_AMBULATORY_CARE_PROVIDER_SITE_OTHER): Payer: BC Managed Care – PPO | Admitting: Family Medicine

## 2015-08-08 ENCOUNTER — Encounter: Payer: Self-pay | Admitting: Family Medicine

## 2015-08-08 VITALS — BP 126/80 | HR 75 | Temp 99.0°F | Resp 16 | Ht 63.0 in | Wt 172.6 lb

## 2015-08-08 DIAGNOSIS — Z131 Encounter for screening for diabetes mellitus: Secondary | ICD-10-CM

## 2015-08-08 DIAGNOSIS — E785 Hyperlipidemia, unspecified: Secondary | ICD-10-CM

## 2015-08-08 DIAGNOSIS — E039 Hypothyroidism, unspecified: Secondary | ICD-10-CM | POA: Diagnosis not present

## 2015-08-08 DIAGNOSIS — I10 Essential (primary) hypertension: Secondary | ICD-10-CM

## 2015-08-08 DIAGNOSIS — Z1211 Encounter for screening for malignant neoplasm of colon: Secondary | ICD-10-CM

## 2015-08-08 DIAGNOSIS — R635 Abnormal weight gain: Secondary | ICD-10-CM

## 2015-08-08 DIAGNOSIS — R7303 Prediabetes: Secondary | ICD-10-CM

## 2015-08-08 MED ORDER — ATORVASTATIN CALCIUM 20 MG PO TABS: ORAL_TABLET | ORAL | Status: DC

## 2015-08-08 MED ORDER — LISINOPRIL 20 MG PO TABS: ORAL_TABLET | ORAL | Status: DC

## 2015-08-08 MED ORDER — LEVOTHYROXINE SODIUM 75 MCG PO TABS: 75.0000 ug | ORAL_TABLET | Freq: Every day | ORAL | Status: DC

## 2015-08-08 NOTE — Progress Notes (Addendum)
[facility] [address], [place] [address] [phone]  Date:  08/08/2015   Name:  Alice Anderson   DOB:  1960/11/28   MRN:  [id number]  PCP:  Dow Adolph, MD    Chief Complaint: Annual Exam and Medication Refill   History of Present Illness:  Alice Anderson is a 54 y.o. very pleasant female patient who presents with the following:  Here today seeking a physical exam.   History of hyperlipidemia, HTN, hypothroidism.  labs done a few days ago- looked fine except borderline glucose.  She is ok with getting an A1c today.   Negative pap earlier this year.   I will be taking over as her PCP now that my partner Dr. Audria Nine has retired  She is on 75 mcg of synthroid.    She went through menopasue at age 64  She plans to have a colonoscopy She has noted a few lbs of weight gain as of late- she is not aware of any change in her eating or exercise habits.  Knows that she could be getting more exercise however   Results for orders placed or performed in visit on 07/14/15  CBC  Result Value Ref Range   WBC 5.4 4.0 - 10.5 K/uL   RBC 4.76 3.87 - 5.11 MIL/uL   Hemoglobin 13.4 12.0 - 15.0 g/dL   HCT 29.5 62.1 - 30.8 %   MCV 82.4 78.0 - 100.0 fL   MCH 28.2 26.0 - 34.0 pg   MCHC 34.2 30.0 - 36.0 g/dL   RDW 65.7 84.6 - 96.2 %   Platelets 348 150 - 400 K/uL   MPV 8.8 8.6 - 12.4 fL  Comprehensive metabolic panel  Result Value Ref Range   Sodium 138 135 - 146 mmol/L   Potassium 4.5 3.5 - 5.3 mmol/L   Chloride 104 98 - 110 mmol/L   CO2 24 20 - 31 mmol/L   Glucose, Bld 102 (H) 65 - 99 mg/dL   BUN 10 7 - 25 mg/dL   Creat 9.52 8.41 - 3.24 mg/dL   Total Bilirubin 0.5 0.2 - 1.2 mg/dL   Alkaline Phosphatase 70 33 - 130 U/L   AST 30 10 - 35 U/L   ALT 36 (H) 6 - 29 U/L   Total Protein 7.0 6.1 - 8.1 g/dL   Albumin 4.2 3.6 - 5.1 g/dL   Calcium 9.3 8.6 - 40.1 mg/dL  Lipid panel  Result Value Ref Range   Cholesterol 145 125 - 200 mg/dL   Triglycerides 027  <253 mg/dL   HDL 36 (L) >=66 mg/dL   Total CHOL/HDL Ratio 4.0 <=5.0 Ratio   VLDL 29 <30 mg/dL   LDL Cholesterol 80 <440 mg/dL  TSH  Result Value Ref Range   TSH 0.475 0.350 - 4.500 uIU/mL   Wt Readings from Last 3 Encounters:  08/08/15 172 lb 9.6 oz (78.291 kg)  03/26/15 166 lb (75.297 kg)  11/10/14 167 lb 12.8 oz (76.114 kg)    Patient Active Problem List   Diagnosis Date Noted  . 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 HTN, goal below 140/90 01/27/2013  . Hypothyroidism 06/22/2012  . Hyperlipidemia with target LDL less than 100 06/22/2012  . Cervical neck pain with evidence of disc disease 06/22/2012  . Obesity (BMI 30.0-34.9) 06/22/2012    Past Medical History  Diagnosis Date  . Hypertension   . Allergy     seasona  . Hyperlipidemia   . Thyroid disease  Past Surgical History  Procedure Laterality Date  . Cesarean section  1998 and 1999    x2    Social History  Substance Use Topics  . Smoking status: Never Smoker   . Smokeless tobacco: Never Used  . Alcohol Use: No    Family History  Problem Relation Age of Onset  . Hypothyroidism Mother 19  . Heart disease Mother 32    chf  . Heart disease Father 5    heart failure  . Diabetes Sister   . Thyroid disease Sister   . Hyperlipidemia Sister   . Hypertension Brother   . Hyperlipidemia Brother   . Colon cancer Neg Hx   . Rectal cancer Neg Hx   . Stomach cancer Neg Hx     No Known Allergies  Medication list has been reviewed and updated.  Current Outpatient Prescriptions on File Prior to Visit  Medication Sig Dispense Refill  . atorvastatin (LIPITOR) 20 MG tablet Take 1 tablet at bedtime. 30 tablet 11  . cetirizine (ZYRTEC) 10 MG tablet Take 10 mg by mouth daily.    Marland Kitchen levothyroxine (SYNTHROID, LEVOTHROID) 75 MCG tablet Take 1 tablet (75 mcg total) by mouth daily. 30 tablet 0  . lisinopril (PRINIVIL,ZESTRIL) 20 MG tablet Take 1/2 tablet daily to lower blood pressure. 15 tablet 0  .  traMADol-acetaminophen (ULTRACET) 37.5-325 MG per tablet Take 1 tablet by mouth every 8 (eight) hours as needed. (Patient not taking: Reported on 11/10/2014) 50 tablet 0   No current facility-administered medications on file prior to visit.    Review of Systems:  As per HPI- otherwise negative.   Physical Examination: Filed Vitals:   08/08/15 1437  BP: 126/80  Pulse: 75  Temp: 99 F (37.2 C)  Resp: 16   Filed Vitals:   08/08/15 1437  Height:  (1.6 m)  Weight: 172 lb 9.6 oz (78.291 kg)   Body mass index is 30.58 kg/(m^2). Ideal Body Weight: Weight in (lb) to have BMI = 25: 140.8  GEN: WDWN, NAD, Non-toxic, A & O x 3, overweight, looks well HEENT: Atraumatic, Normocephalic. Neck supple. No masses, No LAD.  Bilateral TM wnl, oropharynx normal.  PEERL,EOMI.   Ears and Nose: No external deformity. CV: RRR, No M/G/R. No JVD. No thrill. No extra heart sounds. PULM: CTA B, no wheezes, crackles, rhonchi. No retractions. No resp. distress. No accessory muscle use. ABD: S, NT, ND EXTR: No c/c/e NEURO Normal gait.  PSYCH: Normally interactive. Conversant. Not depressed or anxious appearing.  Calm demeanor.  Breast: normal exam, no masses/ dimpling/ discharge Superficial Sebaceous cyst on right chest wall above breast- expressed sebaceous material for her   Assessment and Plan: Essential hypertension  Screening for colon cancer - Plan: Cologuard  Screening for diabetes mellitus - Plan: Hemoglobin A1c  Dyslipidemia  Weight gain  Hypothyroidism, unspecified hypothyroidism type - Plan: levothyroxine (SYNTHROID, LEVOTHROID) 75 MCG tablet  HTN, goal below 140/90 - Plan: lisinopril (PRINIVIL,ZESTRIL) 20 MG tablet  Hyperlipidemia with target LDL less than 100 - Plan: atorvastatin (LIPITOR) 20 MG tablet  Discussed recent labs and added on an A1c today- follow-up with A1c Refilled medications Discussed weight gain and strategies to combat this Message to pt about mammo She  has not yet had a colonoscopy- she is interested in cologuard.  Will set this up for her   Signed Abbe Amsterdam, MD  [facility].  I will send her a mychart message about her A1c- please email me back  Results for orders placed or performed in visit on 08/08/15  Hemoglobin A1c  Result Value Ref Range   Hgb A1c MFr Bld 6.4 (H) <5.7 %   Mean Plasma Glucose 137 (H) <117 mg/dL

## 2015-08-08 NOTE — Patient Instructions (Signed)
It was great to see you today- I will be in touch regarding your A1c (average blood sugar over 3 months) test As we discussed, weight gain of a few lbs is very common after menopause.  This can be quite a challenge!  One thing I would suggest would be weight training; building muscle mass will help you maintain your metabolism.  Cardio such as walking is also important but don't neglect your muscles!  The rest of your recent labs looked fine. You will be contacted by the cologuard company about doing this test for colon cancer screening  Please see me in about 6 months for a recheck- we can do your physical next time

## 2015-08-09 LAB — HEMOGLOBIN A1C
HEMOGLOBIN A1C: 6.4 % — AB (ref <5.7)
Mean Plasma Glucose: 137 mg/dL — ABNORMAL HIGH (ref <117)

## 2015-08-10 ENCOUNTER — Encounter: Payer: Self-pay | Admitting: Family Medicine

## 2015-08-10 DIAGNOSIS — R7303 Prediabetes: Secondary | ICD-10-CM | POA: Insufficient documentation

## 2015-08-14 MED ORDER — METFORMIN HCL 500 MG PO TABS: 500.0000 mg | ORAL_TABLET | Freq: Every day | ORAL | Status: DC

## 2015-08-25 ENCOUNTER — Encounter: Payer: Self-pay | Admitting: Family Medicine

## 2015-09-05 LAB — COLOGUARD

## 2015-09-19 ENCOUNTER — Telehealth: Payer: Self-pay

## 2015-09-19 NOTE — Telephone Encounter (Signed)
Patient was seen by Dr. Patsy Lageropland a few weeks back and had a cologuard test. She would like the results of the test. Also, she was told that our office would verify that her insurance would cover the test and she received a letter stating the test is not covered. Please call back at [phone].

## 2015-09-20 NOTE — Telephone Encounter (Signed)
Please give her a call- we order the test but the Cologuard company checks with her insurance and actually sends the test to the pt. They are in charge of verifying insurance coverage per my understanding.    I am out of town but will look into this for her on Monday and will call her back. She might also call the [employer] (if she still have any materials from them) and ask about her insurance coverage

## 2015-09-20 NOTE — Telephone Encounter (Signed)
Looks negative

## 2015-09-24 ENCOUNTER — Encounter: Payer: Self-pay | Admitting: Family Medicine

## 2015-09-24 DIAGNOSIS — E119 Type 2 diabetes mellitus without complications: Secondary | ICD-10-CM

## 2015-09-24 NOTE — Telephone Encounter (Signed)
Dr. Cyndie Chimeopland's statement is correct.Marland Kitchen.Marland Kitchen.Just like sending a person for a colonoscopy. We send the ins information to Colog[employer] and they verify coverage.

## 2015-09-24 NOTE — Telephone Encounter (Signed)
Alice Anderson what do you think?

## 2015-09-26 ENCOUNTER — Telehealth: Payer: Self-pay

## 2015-09-26 NOTE — Telephone Encounter (Signed)
-----   Message from Pearline CablesJessica C Copland, MD sent at 09/25/2015  2:10 PM EST ----- Hi- this pt contacted me looking for her cologuard report.  As far as I know I have not seen it.  Can we call and get them to fax the results?  Also she said that her insurance did not cover the test- my understanding was that the [employer] company counseled the patient about their coverage.  Is this no right?  Can we please ask them this as well? Thanks- JC

## 2015-09-26 NOTE — Telephone Encounter (Signed)
Spoke with cologuard. They faxed over the labs on 11/23 and they were negative. They will re fax and I will put in your box  As far as insurance, (I did not know this), but apparently all they do is bill the ins for the test. They are not able to verify eligibility. It is up to the patient to verify eligiblilty.

## 2015-09-27 ENCOUNTER — Other Ambulatory Visit (INDEPENDENT_AMBULATORY_CARE_PROVIDER_SITE_OTHER): Payer: BC Managed Care – PPO

## 2015-09-27 DIAGNOSIS — E119 Type 2 diabetes mellitus without complications: Secondary | ICD-10-CM

## 2015-09-27 LAB — POCT GLYCOSYLATED HEMOGLOBIN (HGB A1C): Hemoglobin A1C: 5.9

## 2015-10-01 NOTE — Telephone Encounter (Signed)
Pt spoke with Dr. Patsy [facility].

## 2015-10-04 ENCOUNTER — Encounter: Payer: Self-pay | Admitting: Family Medicine

## 2015-11-08 ENCOUNTER — Telehealth: Payer: Self-pay | Admitting: Family Medicine

## 2015-11-08 NOTE — Telephone Encounter (Signed)
lmom to call and reschedule her appt with another provider for an OV Copland is leaving the practice

## 2015-11-16 ENCOUNTER — Ambulatory Visit (INDEPENDENT_AMBULATORY_CARE_PROVIDER_SITE_OTHER): Payer: BC Managed Care – PPO | Admitting: Physician Assistant

## 2015-11-16 ENCOUNTER — Ambulatory Visit (INDEPENDENT_AMBULATORY_CARE_PROVIDER_SITE_OTHER): Payer: Worker's Compensation | Admitting: Physician Assistant

## 2015-11-16 VITALS — BP 120/82 | HR 80 | Temp 98.1°F | Resp 20 | Ht 63.0 in | Wt 165.0 lb

## 2015-11-16 DIAGNOSIS — T7589XA Other specified effects of external causes, initial encounter: Secondary | ICD-10-CM | POA: Diagnosis not present

## 2015-11-16 DIAGNOSIS — E119 Type 2 diabetes mellitus without complications: Secondary | ICD-10-CM

## 2015-11-16 DIAGNOSIS — F430 Acute stress reaction: Secondary | ICD-10-CM

## 2015-11-16 LAB — POCT GLYCOSYLATED HEMOGLOBIN (HGB A1C): Hemoglobin A1C: 6.2

## 2015-11-16 MED ORDER — CLONAZEPAM 1 MG PO TABS: 1.0000 mg | ORAL_TABLET | Freq: Two times a day (BID) | ORAL | Status: DC | PRN

## 2015-11-16 MED ORDER — RALTEGRAVIR POTASSIUM 400 MG PO TABS: 400.0000 mg | ORAL_TABLET | Freq: Two times a day (BID) | ORAL | Status: DC

## 2015-11-16 MED ORDER — EMTRICITABINE-TENOFOVIR DF 200-300 MG PO TABS: 1.0000 | ORAL_TABLET | Freq: Every day | ORAL | Status: DC

## 2015-11-16 NOTE — Progress Notes (Signed)
   11/26/2015 12:07 PM   DOB: 06-19-1961 / MRN: [id number]  SUBJECTIVE:  Alice Anderson is a 55 y.o. female presenting for an A1c check.  Reports she recently stopped her Metformin.  Denies sock and glove paresthesia, chest pain, SOB.   She has No Known Allergies.   She  has a past medical history of Hypertension; Allergy; Hyperlipidemia; and Thyroid disease.    She  reports that she has never smoked. She has never used smokeless tobacco. She reports that she does not drink alcohol or use illicit drugs. She  reports that she currently engages in sexual activity. She reports using the following method of birth control/protection: Condom. The patient  has past surgical history that includes Cesarean section (1998 and 1999).  Her family history includes Diabetes in her sister; Heart disease (age of onset: 38) in her father; Heart disease (age of onset: 50) in her mother; Hyperlipidemia in her brother and sister; Hypertension in her brother; Hypothyroidism (age of onset: 58) in her mother; Thyroid disease in her sister. There is no history of Colon cancer, Rectal cancer, or Stomach cancer.  ROS  Per HPI  Problem list and medications reviewed and updated by myself where necessary, and exist elsewhere in the encounter.   OBJECTIVE:  There were no vitals taken for this visit.  Physical Exam  Constitutional: She is oriented to person, place, and time. She appears well-nourished. No distress.  Eyes: EOM are normal. Pupils are equal, round, and reactive to light.  Cardiovascular: Regular rhythm and normal heart sounds.   Pulmonary/Chest: Effort normal and breath sounds normal.  Abdominal: She exhibits no distension.  Neurological: She is alert and oriented to person, place, and time. No cranial nerve deficit. Gait normal.  Skin: Skin is dry. She is not diaphoretic.  Psychiatric: She has a normal mood and affect.    No results found.  ASSESSMENT AND PLAN  Diagnoses and all orders for this  visit:  Controlled type 2 diabetes mellitus without complication, without long-term current use of insulin [facility]): Patient here for a workman's comp related issue and requesting an A1c check.  She has a follow up with her PCP in place.  She is a well controlled diabetic.  -     POCT glycosylated hemoglobin (Hb A1C)   The patient was advised to call or return to clinic  if she does not see an improvement in symptoms or to seek the care of the closest emergency department if she worsens with the above plan.   Deliah Boston, MHS, PA-C [facility] 11/26/2015 12:07 PM
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
## 2015-11-17 ENCOUNTER — Telehealth: Payer: Self-pay

## 2015-11-17 LAB — HEPATITIS B SURFACE ANTIBODY, QUANTITATIVE: Hepatitis B-Post: 0 m[IU]/mL

## 2015-11-17 LAB — HEPATITIS B SURFACE ANTIGEN: Hepatitis B Surface Ag: NEGATIVE

## 2015-11-17 LAB — HEPATITIS C ANTIBODY: HCV AB: NEGATIVE

## 2015-11-17 LAB — HIV ANTIBODY (ROUTINE TESTING W REFLEX): HIV: NONREACTIVE
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
## 2015-11-19 ENCOUNTER — Encounter: Payer: Self-pay | Admitting: Physician Assistant

## 2015-11-19 NOTE — Telephone Encounter (Signed)
Please advise that we were unable to achieve source blood specimen.  This decision to stop HIV prophylaxis is up to her.  The risk is very low even if there was a contamination, and impossible if there were no bodily fluids.  Please also advise that her labs are normal and advise that we recheck in 6 weeks.

## 2015-11-19 NOTE — Telephone Encounter (Signed)
Alice Anderson  Please see previous message

## 2015-11-20 ENCOUNTER — Telehealth: Payer: Self-pay

## 2015-11-20 NOTE — Telephone Encounter (Signed)
The patient called about a medication change that she needs due to W/C not covering the medication costs.  She sent a message to Deliah Boston on [place] but had not heard back yet.  She said she took the medication for three days but it was too expensive to continue the medication.  Medications: raltegravir (ISENTRESS) 400 MG tablet and emtricitabine-tenofovir (TRUVADA) 200-300 MG tablet.  She would like generic or alternative medications that are cheaper.  CB#: [phone]

## 2015-11-21 ENCOUNTER — Ambulatory Visit: Payer: BC Managed Care – PPO | Admitting: Family Medicine

## 2015-11-21 NOTE — Telephone Encounter (Signed)
Please advise 
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
## 2015-11-23 NOTE — Telephone Encounter (Signed)
Thank you Joni Reining.  Deliah Boston, MS, PA-C 7:27 PM, 11/23/2015

## 2015-11-27 NOTE — Telephone Encounter (Signed)
Called patient again. LMOM to call back if she continues to have issues.

## 2015-12-05 NOTE — Telephone Encounter (Signed)
Left a message for patient to return call.  We are returning her call.

## 2016-01-15 ENCOUNTER — Encounter: Payer: Self-pay | Admitting: Family Medicine

## 2016-01-15 ENCOUNTER — Ambulatory Visit (INDEPENDENT_AMBULATORY_CARE_PROVIDER_SITE_OTHER): Payer: BC Managed Care – PPO | Admitting: Family Medicine

## 2016-01-15 VITALS — BP 112/73 | HR 76 | Temp 98.6°F | Resp 16 | Ht 63.0 in | Wt 164.0 lb

## 2016-01-15 DIAGNOSIS — N649 Disorder of breast, unspecified: Secondary | ICD-10-CM

## 2016-01-15 DIAGNOSIS — E119 Type 2 diabetes mellitus without complications: Secondary | ICD-10-CM | POA: Diagnosis not present

## 2016-01-15 DIAGNOSIS — L988 Other specified disorders of the skin and subcutaneous tissue: Secondary | ICD-10-CM

## 2016-01-15 DIAGNOSIS — Z8249 Family history of ischemic heart disease and other diseases of the circulatory system: Secondary | ICD-10-CM | POA: Diagnosis not present

## 2016-01-15 NOTE — Patient Instructions (Addendum)
  Yoga website that I like- [email or web address]  Please come in after May 3 to have your hemoglobin A1C checked - lab only visit. Tuesday or Thursday at the appointment center.   - Please call  Or go on your mychart account for an appointment for physical in October  IF you received an x-ray today, you will receive an invoice from [facility]. Please contact [facility] at [phone] with questions or concerns regarding your invoice.   IF you received labwork today, you will receive an invoice from [facility]. Please contact [facility] at [phone] with questions or concerns regarding your invoice.   Our billing staff will not be able to assist you with questions regarding bills from these companies.  You will be contacted with the lab results as soon as they are available. The fastest way to get your results is to activate your My Chart account. Instructions are located on the last page of this paperwork. If you have not heard from us regarding the results in 2 weeks, please contact this office.

## 2016-01-15 NOTE — Progress Notes (Signed)
Subjective:    Patient ID: Alice Anderson, female    DOB: [birth year], 55 y.o.   MRN: [id number]  HPI This is a pleasant 55 yo female who presents today for follow up of DM. She is currently off medication and has been watching her diet and trying to exercise more. Her oldest daughter is at [employer] and her youngest will be graduating from [employer] and going to either [employer] or [employer].   She is concerned about risk of CAD given her family history. Both of her parents had CAD and her brother had an MI in his 5030s. She had a stress test about 8 years ago but would like advice regarding additional screening. She continues to take atorvastatin.   She had left knee pain about a month ago and saw ortho who injected the knee and told her that she has arthritis. It is slowly getting better but has decreased her ability to exercise regularly.   She has a skin lesion on her left breast that she is interested in having removed. It has been unchanged for several years.   Past Medical History  Diagnosis Date  . Hypertension   . Allergy     seasona  . Hyperlipidemia   . Thyroid disease    Past Surgical History  Procedure Laterality Date  . Cesarean section  1998 and 1999    x2   Family History  Problem Relation Age of Onset  . Hypothyroidism Mother 4050  . Heart disease Mother 7082    chf  . Heart disease Father 4055    heart failure  . Diabetes Sister   . Thyroid disease Sister   . Hyperlipidemia Sister   . Hypertension Brother   . Hyperlipidemia Brother   . Colon cancer Neg Hx   . Rectal cancer Neg Hx   . Stomach cancer Neg Hx    Social History  Substance Use Topics  . Smoking status: Never Smoker   . Smokeless tobacco: Never Used  . Alcohol Use: No    Review of Systems No chest pain, occasional dyspnea with exertion, no edema    Objective:   Physical Exam Physical Exam  Constitutional: Oriented to person, place, and time. She appears well-developed and well-nourished.  HENT:  Head:  Normocephalic and atraumatic.  Eyes: Conjunctivae are normal.  Neck: Normal range of motion. Neck supple.  Cardiovascular: Normal rate, regular rhythm and normal heart sounds.   Pulmonary/Chest: Effort normal and breath sounds normal.  Musculoskeletal: Normal range of motion.  Neurological: Alert and oriented to person, place, and time.  Skin: Skin is warm and dry. upper right breast with 1.5 cm firm, raised, purple lesion with small dark center.  Psychiatric: Normal mood and affect. Behavior is normal. Judgment and thought content normal.  Vitals reviewed.  BP 112/73 mmHg  Pulse 76  Temp(Src) 98.6 F (37 C)  Resp 16  Ht 5\' 3"  (1.6 m)  Wt 164 lb (74.39 kg)  BMI 29.06 kg/m2 Wt Readings from Last 3 Encounters:  01/15/16 164 lb (74.39 kg)  11/16/15 165 lb (74.844 kg)  08/08/15 172 lb 9.6 oz (78.291 kg)      Assessment & Plan:  1. Skin lesion of breast - given location and size, will refer to dermatology for removal - Ambulatory referral to Dermatology  2. Controlled type 2 diabetes mellitus without complication, without long-term current use of insulin (HCC) - encouraged continued healthy food choices, increased exercise (discussed yoga/stretching and finding exercises that don't aggravate knee pain) -  Hemoglobin A1c; Future- Due 5/17 - Ambulatory referral to Cardiology  3. Family history of early CAD - Ambulatory referral to Cardiology  - f/u CPE in 6 months  Olean Ree, FNP-BC  [facility]  01/15/2016 5:43 PM

## 2016-01-23 ENCOUNTER — Ambulatory Visit: Payer: Self-pay | Admitting: Physician Assistant

## 2016-01-30 ENCOUNTER — Ambulatory Visit: Payer: BC Managed Care – PPO | Admitting: Family Medicine

## 2016-02-03 ENCOUNTER — Encounter: Payer: Self-pay | Admitting: Family Medicine

## 2016-03-15 ENCOUNTER — Telehealth: Payer: Self-pay | Admitting: Radiology

## 2016-03-15 NOTE — Telephone Encounter (Signed)
Patient called to check on lab orders/ advised her orders are in the computer

## 2016-03-22 ENCOUNTER — Other Ambulatory Visit: Payer: Self-pay | Admitting: Family Medicine

## 2016-03-22 DIAGNOSIS — T7589XD Other specified effects of external causes, subsequent encounter: Secondary | ICD-10-CM

## 2016-03-27 ENCOUNTER — Ambulatory Visit (INDEPENDENT_AMBULATORY_CARE_PROVIDER_SITE_OTHER): Payer: BC Managed Care – PPO | Admitting: Cardiology

## 2016-03-27 ENCOUNTER — Ambulatory Visit (INDEPENDENT_AMBULATORY_CARE_PROVIDER_SITE_OTHER): Payer: Worker's Compensation | Admitting: Family Medicine

## 2016-03-27 ENCOUNTER — Telehealth: Payer: Self-pay

## 2016-03-27 ENCOUNTER — Encounter: Payer: Self-pay | Admitting: Cardiology

## 2016-03-27 ENCOUNTER — Other Ambulatory Visit (INDEPENDENT_AMBULATORY_CARE_PROVIDER_SITE_OTHER): Payer: BC Managed Care – PPO

## 2016-03-27 VITALS — BP 118/78 | HR 79 | Ht 63.0 in | Wt 159.8 lb

## 2016-03-27 DIAGNOSIS — E119 Type 2 diabetes mellitus without complications: Secondary | ICD-10-CM

## 2016-03-27 DIAGNOSIS — E785 Hyperlipidemia, unspecified: Secondary | ICD-10-CM | POA: Diagnosis not present

## 2016-03-27 DIAGNOSIS — T7589XD Other specified effects of external causes, subsequent encounter: Secondary | ICD-10-CM

## 2016-03-27 DIAGNOSIS — Z8249 Family history of ischemic heart disease and other diseases of the circulatory system: Secondary | ICD-10-CM

## 2016-03-27 DIAGNOSIS — T7589XA Other specified effects of external causes, initial encounter: Secondary | ICD-10-CM | POA: Diagnosis not present

## 2016-03-27 DIAGNOSIS — I10 Essential (primary) hypertension: Secondary | ICD-10-CM

## 2016-03-27 HISTORY — DX: Family history of ischemic heart disease and other diseases of the circulatory system: Z82.49

## 2016-03-27 LAB — HIV ANTIBODY (ROUTINE TESTING W REFLEX): HIV: NONREACTIVE

## 2016-03-27 LAB — HEMOGLOBIN A1C
HEMOGLOBIN A1C: 6.1 % — AB (ref <5.7)
MEAN PLASMA GLUCOSE: 128 mg/dL

## 2016-03-27 NOTE — Progress Notes (Signed)
Pt. Came in for a lab only visit, but she had two labs ordered on her orginial orders, one for hiv one for an a1c after her blood was drawn pt. Stated that one lab is suppose to be billed to workers comp. The other to her insurance. The pt. Now has two encounters for today one for lab only to her a1c and should be a lab only visit for workers comp.

## 2016-03-27 NOTE — Patient Instructions (Signed)
Medication Instructions:  Your physician recommends that you continue on your current medications as directed. Please refer to the Current Medication list given to you today.   Labwork: None  Testing/Procedures: Your physician has requested that you have an exercise tolerance test. For further information please visit [email or web address]. Please also follow instruction sheet, as given.  Dr. Turner recommends you have a CORONARY CALCIUM SCORE.  Follow-Up: Your physician recommends that you schedule a follow-up appointment AS NEEDED with Dr. Turner pending study results.   Any Other Special Instructions Will Be Listed Below (If Applicable).     If you need a refill on your cardiac medications before your next appointment, please call your pharmacy.   

## 2016-03-27 NOTE — Progress Notes (Signed)
Cardiology Office Note    Date:  03/27/2016   ID:  Alice Shelterchla Labrosse, DOB Apr 02, 1961, MRN [id number]  PCP:  Emi BelfastGessner, Deborah B, FNP  Cardiologist:  Armanda Magicraci Haydon Kalmar, MD   Chief Complaint  Patient presents with  . New Evaluation    family history of CAD    History of Present Illness:  Alice Anderson is a 55 y.o. female with a history of HTN and dyslipidemia who presents here today for evaluation for cardiac disease.  She saw her PCP back in April and voiced concerns about her heart health because she has a family history of CAD.  Her Dad had CAD and MI in his 2250's and her mom had CHF.  Marland Kitchen. She had a stress test about 8 years ago but wants reevaluation.  She has never smoked.  She denies any exertional chest pain, SOB, DOE, LE edema, dizziness, palpitations or syncope.  She does occasionally have a brief sharp pain in her chest.    Past Medical History  Diagnosis Date  . Hypertension   . Allergy     seasona  . Hyperlipidemia   . Thyroid disease   . Family history of early CAD 03/27/2016    Past Surgical History  Procedure Laterality Date  . Cesarean section  1998 and 1999    x2    Current Medications: Outpatient Prescriptions Prior to Visit  Medication Sig Dispense Refill  . atorvastatin (LIPITOR) 20 MG tablet Take 1 tablet at bedtime. 90 tablet 3  . levothyroxine (SYNTHROID, LEVOTHROID) 75 MCG tablet Take 1 tablet (75 mcg total) by mouth daily. 90 tablet 3  . lisinopril (PRINIVIL,ZESTRIL) 20 MG tablet Take 1/2 tablet daily to lower blood pressure. 45 tablet 3  . cetirizine (ZYRTEC) 10 MG tablet Take 10 mg by mouth daily. Reported on 03/27/2016     No facility-administered medications prior to visit.     Allergies:   Review of patient's allergies indicates no known allergies.   Social History   Social History  . Marital Status: Married    Spouse Name: N/A  . Number of Children: N/A  . Years of Education: college   Occupational History  . teacher    Social History Main  Topics  . Smoking status: Never Smoker   . Smokeless tobacco: Never Used  . Alcohol Use: No  . Drug Use: No  . Sexual Activity: Yes    Birth Control/ Protection: Condom   Other Topics Concern  . None   Social History Narrative   Married. Education: [employer]. Exercise: Yes.     Family History:  The patient's family history includes Diabetes in her sister; Heart attack in her father; Heart disease (age of onset: 5755) in her father; Heart disease (age of onset: 4982) in her mother; Hyperlipidemia in her brother and sister; Hypertension in her brother; Hypothyroidism (age of onset: 250) in her mother; Thyroid disease in her sister. There is no history of Colon cancer, Rectal cancer, or Stomach cancer.   ROS:   Please see the history of present illness.    ROS All other systems reviewed and are negative.   PHYSICAL EXAM:   VS:  BP 118/78 mmHg  Pulse 79  Ht 5\' 3"  (1.6 m)  Wt 159 lb 12.8 oz (72.485 kg)  BMI 28.31 kg/m2   GEN: Well nourished, well developed, in no acute distress HEENT: normal Neck: no JVD, carotid bruits, or masses Cardiac: RRR; no murmurs, rubs, or gallops,no edema.  Intact distal pulses bilaterally.  Respiratory:  clear to auscultation bilaterally, normal work of breathing GI: soft, nontender, nondistended, + BS MS: no deformity or atrophy Skin: warm and dry, no rash Neuro:  Alert and Oriented x 3, Strength and sensation are intact Psych: euthymic mood, full affect  Wt Readings from Last 3 Encounters:  03/27/16 159 lb 12.8 oz (72.485 kg)  01/15/16 164 lb (74.39 kg)  11/16/15 165 lb (74.844 kg)      Studies/Labs Reviewed:   EKG:  EKG is ordered today.  The ekg ordered today demonstrates NSR with no ST changes  Recent Labs: 07/14/2015: ALT 36*; BUN 10; Creat 0.62; Hemoglobin 13.4; Platelets 348; Potassium 4.5; Sodium 138; TSH 0.475   Lipid Panel    Component Value Date/Time   CHOL 145 07/14/2015 0814   TRIG 145 07/14/2015 0814   HDL 36* 07/14/2015 0814    CHOLHDL 4.0 07/14/2015 0814   VLDL 29 07/14/2015 0814   LDLCALC 80 07/14/2015 0814    Additional studies/ records that were reviewed today include:  none    ASSESSMENT:    1. Family history of early CAD   2. HTN, goal below 140/90   3. Hyperlipidemia with target LDL less than 100      PLAN:  In order of problems listed above:  1. Family history of CAD at an early age in Father.  Her CRFs other than family history ar HTN and dyslipidemia.  She has never smoked.  Her EKG is nonischemic.  I will get an ETT to rule out ischemia and a Chest CT for calcium score to assess future cardiovascular risk.   2. HTN - Bp well controlled on current meds. 3. Hyperlipidemia - LDL at goal    Medication Adjustments/Labs and Tests Ordered: Current medicines are reviewed at length with the patient today.  Concerns regarding medicines are outlined above.  Medication changes, Labs and Tests ordered today are listed in the Patient Instructions below.  There are no Patient Instructions on file for this visit.   Signed, Armanda Magic, MD  03/27/2016 8:48 AM    [facility] [address] [place], [place], [place]  [address] Phone: [phone]; Fax: [phone]

## 2016-03-27 NOTE — Telephone Encounter (Signed)
Alice Anderson,  Hey so this pt. Came in for a lab only visit, so I released the orders and she had her blood drawn but the pt. Said one lab is suppose to be billed to her insurance and the other to workers comp. She did tell the girls up front this, so they had to make two different encounters for her. One for her a1c lab only visit, and a workers comp. Encounter. I reordered the HIV lab for her workers comp. However, I do not have the authority to close the workers comp. Encounter. Is there any way that you can. I have left all my notes for this pt. In the chart. Please let me know if you have any questions. Sorry if this is confusing.

## 2016-03-27 NOTE — Progress Notes (Signed)
Pt. Here for a lab only visit 

## 2016-04-17 ENCOUNTER — Inpatient Hospital Stay: Admission: RE | Admit: 2016-04-17 | Payer: BC Managed Care – PPO | Source: Ambulatory Visit

## 2016-04-18 ENCOUNTER — Ambulatory Visit (INDEPENDENT_AMBULATORY_CARE_PROVIDER_SITE_OTHER)
Admission: RE | Admit: 2016-04-18 | Discharge: 2016-04-18 | Disposition: A | Discharge status: Home / Self Care | Payer: Self-pay | Source: Ambulatory Visit | Attending: Cardiology | Admitting: Cardiology

## 2016-04-18 ENCOUNTER — Ambulatory Visit (INDEPENDENT_AMBULATORY_CARE_PROVIDER_SITE_OTHER): Payer: BC Managed Care – PPO

## 2016-04-18 DIAGNOSIS — Z8249 Family history of ischemic heart disease and other diseases of the circulatory system: Secondary | ICD-10-CM

## 2016-04-18 LAB — EXERCISE TOLERANCE TEST
CHL CUP RESTING HR STRESS: 76 {beats}/min
CHL RATE OF PERCEIVED EXERTION: 15
CSEPEDS: 0 s
CSEPEW: 10.1 METS
CSEPPHR: 162 {beats}/min
Exercise duration (min): 9 min
MPHR: 166 {beats}/min
Percent HR: 97 %

## 2016-05-20 ENCOUNTER — Encounter: Payer: Self-pay | Admitting: Family Medicine

## 2016-08-11 ENCOUNTER — Telehealth: Payer: Self-pay

## 2016-08-11 DIAGNOSIS — E039 Hypothyroidism, unspecified: Secondary | ICD-10-CM

## 2016-08-11 DIAGNOSIS — Z0000 Encounter for general adult medical examination without abnormal findings: Secondary | ICD-10-CM

## 2016-08-11 DIAGNOSIS — R7303 Prediabetes: Secondary | ICD-10-CM

## 2016-08-11 DIAGNOSIS — J309 Allergic rhinitis, unspecified: Secondary | ICD-10-CM | POA: Insufficient documentation

## 2016-08-11 DIAGNOSIS — I10 Essential (primary) hypertension: Secondary | ICD-10-CM

## 2016-08-11 DIAGNOSIS — J342 Deviated nasal septum: Secondary | ICD-10-CM | POA: Insufficient documentation

## 2016-08-11 DIAGNOSIS — Z136 Encounter for screening for cardiovascular disorders: Secondary | ICD-10-CM

## 2016-08-11 DIAGNOSIS — Z1383 Encounter for screening for respiratory disorder NEC: Secondary | ICD-10-CM

## 2016-08-11 DIAGNOSIS — E785 Hyperlipidemia, unspecified: Secondary | ICD-10-CM

## 2016-08-11 DIAGNOSIS — Z1389 Encounter for screening for other disorder: Secondary | ICD-10-CM

## 2016-08-11 DIAGNOSIS — Z5181 Encounter for therapeutic drug level monitoring: Secondary | ICD-10-CM

## 2016-08-11 DIAGNOSIS — E559 Vitamin D deficiency, unspecified: Secondary | ICD-10-CM

## 2016-08-11 NOTE — Telephone Encounter (Signed)
PATIENT'S PCP WAS FORMERLY DEBBIE GESSNER. SHE IS CALLING TO SET UP AN APPOINTMENT FOR A COMPLETE PHYSICAL WITH DR. SHAW ON Friday NOV. 10, 2017. SHE SAID DEBBIE ALWAYS PUT IN HER LAB ORDER AHEAD OF HER HAVING HER PHYSICAL SO THAT THE RESULTS WOULD BE BACK WHEN SHE CAME IN. SHE WOULD LIKE TO HAVE THAT DONE AGAIN THIS TIME. PLEASE CALL HER WHEN IT HAS BEEN AUTHORIZED SO THAT SHE CAN MAKE THE APPOINTMENT.  BEST PHONE [phone] (CELL) MBC

## 2016-08-15 NOTE — Telephone Encounter (Signed)
Dr Clelia CroftShaw, please order whatever labs that you want done for pt as req'd and then we can contact pt to come in.

## 2016-08-18 NOTE — Telephone Encounter (Signed)
It looks like pt has an appt at the [facility] on Fri at 8 am but it just says "female only - cpe" so I already have another 8 am cpe and 8:30 chronic disease f/u.  Likely either pt will have to see someone else or could be rescheduled for 9 am or later on Fri 11/10 or I could see her on Sat 11/11 - there's the 8 am open there if she prefers.    Labs have been ordered for her to have done hat her convenience.

## 2016-08-20 ENCOUNTER — Other Ambulatory Visit: Payer: Self-pay | Admitting: Family Medicine

## 2016-08-20 DIAGNOSIS — E785 Hyperlipidemia, unspecified: Secondary | ICD-10-CM

## 2016-08-21 ENCOUNTER — Other Ambulatory Visit: Payer: Self-pay | Admitting: Emergency Medicine

## 2016-08-22 ENCOUNTER — Ambulatory Visit (INDEPENDENT_AMBULATORY_CARE_PROVIDER_SITE_OTHER): Payer: BC Managed Care – PPO | Admitting: Family Medicine

## 2016-08-22 VITALS — BP 110/64 | HR 72 | Temp 98.2°F | Resp 16 | Ht 63.0 in | Wt 161.0 lb

## 2016-08-22 DIAGNOSIS — Z0000 Encounter for general adult medical examination without abnormal findings: Secondary | ICD-10-CM

## 2016-08-22 DIAGNOSIS — R7303 Prediabetes: Secondary | ICD-10-CM

## 2016-08-22 DIAGNOSIS — E119 Type 2 diabetes mellitus without complications: Secondary | ICD-10-CM

## 2016-08-22 DIAGNOSIS — Z130 Encounter for screening for diseases of the blood and blood-forming organs and certain disorders involving the immune mechanism: Secondary | ICD-10-CM

## 2016-08-22 DIAGNOSIS — Z5181 Encounter for therapeutic drug level monitoring: Secondary | ICD-10-CM

## 2016-08-22 DIAGNOSIS — E785 Hyperlipidemia, unspecified: Secondary | ICD-10-CM | POA: Diagnosis not present

## 2016-08-22 DIAGNOSIS — Z1389 Encounter for screening for other disorder: Secondary | ICD-10-CM

## 2016-08-22 DIAGNOSIS — I10 Essential (primary) hypertension: Secondary | ICD-10-CM

## 2016-08-22 DIAGNOSIS — E559 Vitamin D deficiency, unspecified: Secondary | ICD-10-CM

## 2016-08-22 DIAGNOSIS — Z1383 Encounter for screening for respiratory disorder NEC: Secondary | ICD-10-CM | POA: Diagnosis not present

## 2016-08-22 DIAGNOSIS — Z136 Encounter for screening for cardiovascular disorders: Secondary | ICD-10-CM | POA: Diagnosis not present

## 2016-08-22 DIAGNOSIS — Z23 Encounter for immunization: Secondary | ICD-10-CM | POA: Diagnosis not present

## 2016-08-22 DIAGNOSIS — H6122 Impacted cerumen, left ear: Secondary | ICD-10-CM | POA: Diagnosis not present

## 2016-08-22 DIAGNOSIS — E039 Hypothyroidism, unspecified: Secondary | ICD-10-CM | POA: Diagnosis not present

## 2016-08-22 DIAGNOSIS — Z1211 Encounter for screening for malignant neoplasm of colon: Secondary | ICD-10-CM

## 2016-08-22 DIAGNOSIS — Z1231 Encounter for screening mammogram for malignant neoplasm of breast: Secondary | ICD-10-CM | POA: Diagnosis not present

## 2016-08-22 LAB — CBC
HCT: 40.7 % (ref 35.0–45.0)
HEMOGLOBIN: 13.6 g/dL (ref 11.7–15.5)
MCH: 28.3 pg (ref 27.0–33.0)
MCHC: 33.4 g/dL (ref 32.0–36.0)
MCV: 84.8 fL (ref 80.0–100.0)
MPV: 8.9 fL (ref 7.5–12.5)
PLATELETS: 335 10*3/uL (ref 140–400)
RBC: 4.8 MIL/uL (ref 3.80–5.10)
RDW: 12.9 % (ref 11.0–15.0)
WBC: 6 10*3/uL (ref 3.8–10.8)

## 2016-08-22 LAB — POCT URINALYSIS DIP (MANUAL ENTRY)
BILIRUBIN UA: NEGATIVE
Glucose, UA: NEGATIVE
Ketones, POC UA: NEGATIVE
LEUKOCYTES UA: NEGATIVE
NITRITE UA: NEGATIVE
PH UA: 6
Protein Ur, POC: NEGATIVE
Spec Grav, UA: 1.02
Urobilinogen, UA: 0.2

## 2016-08-22 LAB — LIPID PANEL
Cholesterol: 159 mg/dL (ref <200)
HDL: 46 mg/dL — AB (ref >50)
LDL Cholesterol: 85 mg/dL (ref <100)
Total CHOL/HDL Ratio: 3.5 Ratio (ref <5.0)
Triglycerides: 139 mg/dL (ref <150)
VLDL: 28 mg/dL (ref <30)

## 2016-08-22 LAB — COMPREHENSIVE METABOLIC PANEL
ALBUMIN: 4.6 g/dL (ref 3.6–5.1)
ALK PHOS: 58 U/L (ref 33–130)
ALT: 20 U/L (ref 6–29)
AST: 21 U/L (ref 10–35)
BUN: 10 mg/dL (ref 7–25)
CO2: 28 mmol/L (ref 20–31)
CREATININE: 0.61 mg/dL (ref 0.50–1.05)
Calcium: 9.5 mg/dL (ref 8.6–10.4)
Chloride: 103 mmol/L (ref 98–110)
Glucose, Bld: 89 mg/dL (ref 65–99)
POTASSIUM: 4.5 mmol/L (ref 3.5–5.3)
SODIUM: 139 mmol/L (ref 135–146)
TOTAL PROTEIN: 7.6 g/dL (ref 6.1–8.1)
Total Bilirubin: 0.5 mg/dL (ref 0.2–1.2)

## 2016-08-22 LAB — VITAMIN D 25 HYDROXY (VIT D DEFICIENCY, FRACTURES): VIT D 25 HYDROXY: 39 ng/mL (ref 30–100)

## 2016-08-22 LAB — TSH: TSH: 1.08 m[IU]/L

## 2016-08-22 MED ORDER — MOMETASONE FUROATE 50 MCG/ACT NA SUSP: 2.0000 | Freq: Every day | NASAL | 12 refills | Status: DC

## 2016-08-22 MED ORDER — LISINOPRIL 20 MG PO TABS: ORAL_TABLET | ORAL | 3 refills | Status: DC

## 2016-08-22 MED ORDER — LEVOTHYROXINE SODIUM 75 MCG PO TABS: 75.0000 ug | ORAL_TABLET | Freq: Every day | ORAL | 3 refills | Status: DC

## 2016-08-22 NOTE — Patient Instructions (Signed)
     IF you received an x-ray today, you will receive an invoice from [facility]. Please contact [facility] at [phone] with questions or concerns regarding your invoice.   IF you received labwork today, you will receive an invoice from [facility]. Please contact [facility] at [phone] with questions or concerns regarding your invoice.   Our billing staff will not be able to assist you with questions regarding bills from these companies.  You will be contacted with the lab results as soon as they are available. The fastest way to get your results is to activate your My Chart account. Instructions are located on the last page of this paperwork. If you have not heard from us regarding the results in 2 weeks, please contact this office.      

## 2016-08-22 NOTE — Progress Notes (Signed)
Subjective:  By signing my name below, I, Essence Howell, attest that this documentation has been prepared under the direction and in the presence of Delman Cheadle, MD Electronically Signed: Ladene Artist, ED Scribe 08/22/2016 at 8:57 AM.   Patient ID: Alice Anderson, female    DOB: 02-24-1961, 55 y.o.   MRN: [id number]   Chief Complaint  Patient presents with  . Annual Exam    Physical with lab work, no pap    HPI HPI Comments: Alice Anderson is a 55 y.o. female who presents to the [facility] for an annual exam. Pt has a h/o type II DM and follows with cardiology. Last CPE with pap smear was January 2016 with Dr. Leward Quan. Normal mammogram done by [facility]. No familial h/o female CAs. Pt did the Cologuard last year which was negative but has not actually had a colonoscopy done. She dose not want to schedule a colonoscopy at this time and would like to do the Cologuard again.   Pt states that she has been having sinus pressure and frontal HAs. She saw [facility] approximately 10 days ago for symptoms and started on Flonase and Zyrtec nightly. Pt denies nosebleeds. She also reports increased cerumen in her left ear that she has tried to remove with a kit that she purchased from the store without significant relief. Pt received a flu vaccine this season at a pharmacy.   Pt is followed by [facility] on [address]. She is overdue for an appointment but agrees to schedule one. Pt states that she takes a multivitamin daily and is getting calcium in the form of milk or yogurt daily. She exercises once a week on the treadmill, elliptical and lifts weights.   Pt reports varicose veins in her lower extremities that she treats with compression socks that come up to her thighs. Pt states that she wears these daily to work and have noticed much relief.   Past Medical History:  Diagnosis Date  . Allergy    seasona  . Family history of early CAD 03/27/2016  . Hyperlipidemia   .  Hypertension   . Thyroid disease    Current Outpatient Prescriptions on File Prior to Visit  Medication Sig Dispense Refill  . levothyroxine (SYNTHROID, LEVOTHROID) 75 MCG tablet Take 1 tablet (75 mcg total) by mouth daily. 90 tablet 3  . lisinopril (PRINIVIL,ZESTRIL) 20 MG tablet Take 1/2 tablet daily to lower blood pressure. 45 tablet 3  . Fexofenadine HCl (ALLEGRA PO) Take by mouth daily.     No current facility-administered medications on file prior to visit.    No Known Allergies  Past Surgical History:  Procedure Laterality Date  . Hector   x2   Family History  Problem Relation Age of Onset  . Hypothyroidism Mother 80  . Heart disease Mother 34    chf  . Heart disease Father 86    heart failure  . Heart attack Father   . Diabetes Sister   . Thyroid disease Sister   . Hyperlipidemia Sister   . Hypertension Brother   . Hyperlipidemia Brother   . Colon cancer Neg Hx   . Rectal cancer Neg Hx   . Stomach cancer Neg Hx    Social History   Social History  . Marital status: Married    Spouse name: N/A  . Number of children: N/A  . 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 Smoking status: Never Smoker  . Smokeless tobacco: Never Used  . Alcohol use No  . Drug use: No  . Sexual activity: Yes    Birth control/ protection: Condom   Other Topics Concern  . None   Social History Narrative   Married. Education: [employer]. Exercise: Yes.   Depression screen [facility] 2/9 08/22/2016 01/15/2016 11/16/2015 08/08/2015 11/10/2014  Decreased Interest 0 0 0 0 0  Down, Depressed, Hopeless 0 0 0 0 0  PHQ - 2 Score 0 0 0 0 0    Review of Systems  HENT: Positive for sinus pain and sinus pressure. Negative for nosebleeds.   Allergic/Immunologic: Positive for environmental allergies.  All other systems reviewed and are negative.  BP 110/64   Pulse 72   Temp 98.2 F (36.8 C) (Oral)    Resp 16   Ht _0  (1.6 m)   Wt 161 lb (73 kg)   SpO2 99%   BMI 28.52 kg/m     Objective:   Physical Exam  Constitutional: She is oriented to person, place, and time. She appears well-developed and well-nourished. No distress.  HENT:  Head: Normocephalic and atraumatic.  Right Ear: Tympanic membrane is retracted.  L TM: impacted with cerumen  Eyes: Conjunctivae and EOM are normal.  Neck: Neck supple. No tracheal deviation present.  Cardiovascular: Normal rate, regular rhythm, S1 normal, S2 normal and normal heart sounds.   Pulmonary/Chest: Effort normal and breath sounds normal. No respiratory distress.  Genitourinary:  Genitourinary Comments: Chaperone present. Normal breast exam.   Musculoskeletal: Normal range of motion.  Neurological: She is alert and oriented to person, place, and time.  Skin: Skin is warm and dry.  Psychiatric: She has a normal mood and affect. Her behavior is normal.  Nursing note and vitals reviewed.     Assessment & Plan:   1. Annual physical exam   2. Encounter for screening mammogram for breast cancer   3. Screening for cardiovascular, respiratory, and genitourinary diseases   4. Screening for deficiency anemia   5. Impacted cerumen of left ear   6. Need for pneumococcal vaccination   7. HTN, goal below 140/90   8. Type 2 diabetes mellitus without complication, without long-term current use of insulin (HCC)   9. Special screening for malignant neoplasms, colon   10. Wellness examination   11. Hyperlipidemia, unspecified hyperlipidemia type   12. Essential hypertension, benign   13. Medication monitoring encounter   14. Hypothyroidism, unspecified type   15. Vitamin D deficiency   16. Prediabetes     Orders Placed This Encounter  Procedures  . Pneumococcal polysaccharide vaccine 23-valent greater than or equal to 2yo subcutaneous/IM  . Cologuard  . Ambulatory referral to Ophthalmology    Referral Priority:   Routine    Referral Type:    Consultation    Referral Reason:   Specialty Services Required    Requested Specialty:   Ophthalmology    Number of Visits Requested:   1  . Ear wax removal    left  . POCT urinalysis dipstick    Meds ordered this encounter  Medications  . levothyroxine (SYNTHROID, LEVOTHROID) 75 MCG tablet    Sig: Take 1 tablet (75 mcg total) by mouth daily.    Dispense:  90 tablet    Refill:  3  . lisinopril (PRINIVIL,ZESTRIL) 20 MG tablet    Sig: Take 1/2 tablet daily to lower blood pressure.  Dispense:  45 tablet    Refill:  3  . mometasone (NASONEX) 50 MCG/ACT nasal spray    Sig: Place 2 sprays into the nose daily.    Dispense:  17 g    Refill:  12    I personally performed the services described in this documentation, which was scribed in my presence. The recorded information has been reviewed and considered, and addended by me as needed.   Delman Cheadle, M.D.  [facility] [address] [place], [place] [address] [phone] phone [phone] fax  09/04/16 12:01 AM

## 2016-08-23 ENCOUNTER — Other Ambulatory Visit: Payer: Self-pay | Admitting: Family Medicine

## 2016-08-23 LAB — HEMOGLOBIN A1C
Hgb A1c MFr Bld: 5.7 % — ABNORMAL HIGH (ref <5.7)
MEAN PLASMA GLUCOSE: 117 mg/dL

## 2016-08-25 ENCOUNTER — Telehealth: Payer: Self-pay

## 2016-08-25 NOTE — Telephone Encounter (Signed)
Left message medication was refilled today

## 2016-08-25 NOTE — Telephone Encounter (Signed)
Looking for the refill on her lipator not sure if provider is waiting on labs to come back before filling it but patient is almost out   Best number [phone] ok to leave a message

## 2016-09-23 ENCOUNTER — Other Ambulatory Visit: Payer: Self-pay | Admitting: Family Medicine

## 2016-12-20 ENCOUNTER — Other Ambulatory Visit: Payer: Self-pay | Admitting: Family Medicine

## 2017-02-11 ENCOUNTER — Other Ambulatory Visit: Payer: Self-pay | Admitting: Family Medicine

## 2017-02-12 ENCOUNTER — Other Ambulatory Visit: Payer: Self-pay | Admitting: Family Medicine

## 2017-02-14 ENCOUNTER — Telehealth: Payer: Self-pay | Admitting: Family Medicine

## 2017-02-14 MED ORDER — ATORVASTATIN CALCIUM 20 MG PO TABS: 20.0000 mg | ORAL_TABLET | Freq: Every day | ORAL | 0 refills | Status: DC

## 2017-02-14 NOTE — Telephone Encounter (Signed)
Pt calling about lipitor 20 mg. Pt said the prescription was originally written by Dr. Patsy Lageropland last year and pt said the pharmacy was sending over a fax for Dr. Clelia CroftShaw to sign to fill it. Pt has an appt on 5/9 with Dr. Katrinka BlazingSmith and needed the medication from now until then. Pt said she has not been able to take the medication since May 1st. Please advise. Best pt callback number [phone].

## 2017-02-18 ENCOUNTER — Ambulatory Visit (INDEPENDENT_AMBULATORY_CARE_PROVIDER_SITE_OTHER): Payer: BC Managed Care – PPO | Admitting: Family Medicine

## 2017-02-18 ENCOUNTER — Encounter: Payer: Self-pay | Admitting: Family Medicine

## 2017-02-18 VITALS — BP 109/74 | HR 72 | Temp 97.7°F | Resp 16 | Ht 63.0 in | Wt 161.6 lb

## 2017-02-18 DIAGNOSIS — Z8249 Family history of ischemic heart disease and other diseases of the circulatory system: Secondary | ICD-10-CM

## 2017-02-18 DIAGNOSIS — E034 Atrophy of thyroid (acquired): Secondary | ICD-10-CM | POA: Diagnosis not present

## 2017-02-18 DIAGNOSIS — E7800 Pure hypercholesterolemia, unspecified: Secondary | ICD-10-CM

## 2017-02-18 DIAGNOSIS — R7303 Prediabetes: Secondary | ICD-10-CM | POA: Diagnosis not present

## 2017-02-18 DIAGNOSIS — I10 Essential (primary) hypertension: Secondary | ICD-10-CM | POA: Diagnosis not present

## 2017-02-18 MED ORDER — ATORVASTATIN CALCIUM 20 MG PO TABS: 20.0000 mg | ORAL_TABLET | Freq: Every day | ORAL | 3 refills | Status: DC

## 2017-02-18 MED ORDER — GLUCOSE BLOOD VI STRP: ORAL_STRIP | 3 refills | Status: DC

## 2017-02-18 MED ORDER — FREESTYLE LANCETS MISC: 5 refills | Status: DC

## 2017-02-18 MED ORDER — FREESTYLE SYSTEM KIT: 1.0000 | PACK | 0 refills | Status: AC | PRN

## 2017-02-18 NOTE — Progress Notes (Signed)
Subjective:    Patient ID: Alice Anderson, female    DOB: [birth year], 56 y.o.   MRN: [id number]  02/18/2017  Medication Refill (Lipitor) and Blood Work (would like to have TSH, lipid panel and blood sugar checked)   HPI This 56 y.o. female presents for evaluation of hypertension, hypercholesterolemia, glucose intolerance, hypothyroidism.  Patient reports good compliance with medication, good tolerance to medication, and good symptom control. Not checking sugar at home.  Desires glucometer.  Home readings less than 120/80.  Intolerant to Metformin in the past; felt horribly on Metformin.   Immunization History  Administered Date(s) Administered  . Influenza Split 06/17/2012, 06/13/2014, 07/01/2014  . Influenza,inj,Quad PF,36+ Mos 07/14/2013  . Influenza-Unspecified 06/14/2015  . Pneumococcal Polysaccharide-23 08/22/2016  . Tdap 03/14/2011   BP Readings from Last 3 Encounters:  02/18/17 109/74  08/22/16 110/64  03/27/16 118/78   Wt Readings from Last 3 Encounters:  02/18/17 161 lb 9.6 oz (73.3 kg)  08/22/16 161 lb (73 kg)  03/27/16 159 lb 12.8 oz (72.5 kg)    Review of Systems  Constitutional: Negative for chills, diaphoresis, fatigue and fever.  Eyes: Negative for visual disturbance.  Respiratory: Negative for cough and shortness of breath.   Cardiovascular: Negative for chest pain, palpitations and leg swelling.  Gastrointestinal: Negative for abdominal pain, constipation, diarrhea, nausea and vomiting.  Endocrine: Negative for cold intolerance, heat intolerance, polydipsia, polyphagia and polyuria.  Neurological: Negative for dizziness, tremors, seizures, syncope, facial asymmetry, speech difficulty, weakness, light-headedness, numbness and headaches.    Past Medical History:  Diagnosis Date  . Allergy    seasona  . Family history of early CAD 03/27/2016  . Hyperlipidemia   . Hypertension   . Thyroid disease    Past Surgical History:  Procedure Laterality Date  .  CESAREAN SECTION  1998 and 1999   x2   No Known Allergies  Social History   Social History  . Marital status: Married    Spouse name: N/A  . Number of children: 2  . Years of education: college   Occupational History  . teacher [employer]   Social History Main Topics  . Smoking status: Never Smoker  . Smokeless tobacco: Never Used  . Alcohol use No  . Drug use: No  . Sexual activity: Yes    Birth control/ protection: Condom   Other Topics Concern  . Not on file   Social History Narrative   Married. Education: [employer]. Exercise: Yes.   Family History  Problem Relation Age of Onset  . Hypothyroidism Mother 6850  . Heart disease Mother 5382    chf  . Heart disease Father 5855    AMI  . Heart attack Father   . Diabetes Sister   . Thyroid disease Sister   . Hyperlipidemia Sister   . Hypertension Brother   . Hyperlipidemia Brother   . Colon cancer Neg Hx   . Rectal cancer Neg Hx   . Stomach cancer Neg Hx        Objective:    BP 109/74   Pulse 72   Temp 97.7 F (36.5 C) (Oral)   Resp 16   Ht 5\' 3"  (1.6 m)   Wt 161 lb 9.6 oz (73.3 kg)   SpO2 97%   BMI 28.63 kg/m  Physical Exam  Constitutional: She is oriented to person, place, and time. She appears well-developed and well-nourished. No distress.  HENT:  Head: Normocephalic and atraumatic.  Right Ear: External  ear normal.  Left Ear: External ear normal.  Nose: Nose normal.  Mouth/Throat: Oropharynx is clear and moist.  Eyes: Conjunctivae and EOM are normal. Pupils are equal, round, and reactive to light.  Neck: Normal range of motion. Neck supple. Carotid bruit is not present. No thyromegaly present.  Cardiovascular: Normal rate, regular rhythm, normal heart sounds and intact distal pulses.  Exam reveals no gallop and no friction rub.   No murmur heard. Pulmonary/Chest: Effort normal and breath sounds normal. She has no wheezes. She has no rales.  Abdominal: Soft. Bowel sounds are  normal. She exhibits no distension and no mass. There is no tenderness. There is no rebound and no guarding.  Lymphadenopathy:    She has no cervical adenopathy.  Neurological: She is alert and oriented to person, place, and time. No cranial nerve deficit.  Skin: Skin is warm and dry. No rash noted. She is not diaphoretic. No erythema. No pallor.  Psychiatric: She has a normal mood and affect. Her behavior is normal.   Depression screen [facility] 2/9 02/18/2017 08/22/2016 01/15/2016 11/16/2015 08/08/2015  Decreased Interest 0 0 0 0 0  Down, Depressed, Hopeless 0 0 0 0 0  PHQ - 2 Score 0 0 0 0 0   Fall Risk  02/18/2017 08/22/2016 01/15/2016 08/08/2015 11/10/2014  Falls in the past year? No No No No No        Assessment & Plan:   1. HTN, goal below 140/90   2. Hypothyroidism due to acquired atrophy of thyroid   3. Pre-diabetes   4. Pure hypercholesterolemia    -controlled; obtain labs; refills provided. -encourage exercise, weight loss, and low-fat low-carbohydrate food choices.   Orders Placed This Encounter  Procedures  . CBC with Differential/Platelet  . Comprehensive metabolic panel    Order Specific Question:   Has the patient fasted?    Answer:   Yes  . Lipid panel    Order Specific Question:   Has the patient fasted?    Answer:   Yes  . Hemoglobin A1c  . T4, free  . TSH   Meds ordered this encounter  Medications  . atorvastatin (LIPITOR) 20 MG tablet    Sig: Take 1 tablet (20 mg total) by mouth at bedtime.    Dispense:  90 tablet    Refill:  3    Return in about 7 months (around 09/20/2017) for complete physical examiniation.  Marybell Robards Paulita Fujita, M.D. [facility] at [facility] [address] [place], [place]  [address] [phone] phone [phone] fax

## 2017-02-18 NOTE — Patient Instructions (Addendum)
   IF you received an x-ray today, you will receive an invoice from [facility]. Please contact [facility] at [phone] with questions or concerns regarding your invoice.   IF you received labwork today, you will receive an invoice from [facility]. Please contact [facility] at [phone] with questions or concerns regarding your invoice.   Our billing staff will not be able to assist you with questions regarding bills from these companies.  You will be contacted with the lab results as soon as they are available. The fastest way to get your results is to activate your My Chart account. Instructions are located on the last page of this paperwork. If you have not heard from us regarding the results in 2 weeks, please contact this office.      Fat and Cholesterol Restricted Diet Getting too much fat and cholesterol in your diet may cause health problems. Following this diet helps keep your fat and cholesterol at normal levels. This can keep you from getting sick. What types of fat should I choose?  Choose monosaturated and polyunsaturated fats. These are found in foods such as olive oil, canola oil, flaxseeds, walnuts, almonds, and seeds.  Eat more omega-3 fats. Good choices include salmon, mackerel, sardines, tuna, flaxseed oil, and ground flaxseeds.  Limit saturated fats. These are in animal products such as meats, butter, and cream. They can also be in plant products such as palm oil, palm kernel oil, and coconut oil.  Avoid foods with partially hydrogenated oils in them. These contain trans fats. Examples of foods that have trans fats are stick margarine, some tub margarines, cookies, crackers, and other baked goods. What general guidelines do I need to follow?  Check food labels. Look for the words "trans fat" and "saturated fat."  When preparing a meal:  Fill half of your plate with vegetables and green salads.  Fill one fourth of your plate with whole  grains. Look for the word "whole" as the first word in the ingredient list.  Fill one fourth of your plate with lean protein foods.  Eat more foods that have fiber, like apples, carrots, beans, peas, and barley.  Eat more home-cooked foods. Eat less at restaurants and buffets.  Limit or avoid alcohol.  Limit foods high in starch and sugar.  Limit fried foods.  Cook foods without frying them. Baking, boiling, grilling, and broiling are all great options.  Lose weight if you are overweight. Losing even a small amount of weight can help your overall health. It can also help prevent diseases such as diabetes and heart disease. What foods can I eat? Grains  Whole grains, such as whole wheat or whole grain breads, crackers, cereals, and pasta. Unsweetened oatmeal, bulgur, barley, quinoa, or brown rice. Corn or whole wheat flour tortillas. Vegetables  Fresh or frozen vegetables (raw, steamed, roasted, or grilled). Green salads. Fruits  All fresh, canned (in natural juice), or frozen fruits. Meat and Other Protein Products  Ground beef (85% or leaner), grass-fed beef, or beef trimmed of fat. Skinless chicken or turkey. Ground chicken or turkey. Pork trimmed of fat. All fish and seafood. Eggs. Dried beans, peas, or lentils. Unsalted nuts or seeds. Unsalted canned or dry beans. Dairy  Low-fat dairy products, such as skim or 1% milk, 2% or reduced-fat cheeses, low-fat ricotta or cottage cheese, or plain low-fat yogurt. Fats and Oils  Tub margarines without trans fats. Light or reduced-fat mayonnaise and salad dressings. Avocado. Olive, canola, sesame, or safflower oils. Natural peanut   or almond butter (choose ones without added sugar and oil). The items listed above may not be a complete list of recommended foods or beverages. Contact your dietitian for more options.  What foods are not recommended? Grains  White bread. White pasta. White rice. Cornbread. Bagels, pastries, and croissants.  Crackers that contain trans fat. Vegetables  White potatoes. Corn. Creamed or fried vegetables. Vegetables in a cheese sauce. Fruits  Dried fruits. Canned fruit in light or heavy syrup. Fruit juice. Meat and Other Protein Products  Fatty cuts of meat. Ribs, chicken wings, bacon, sausage, bologna, salami, chitterlings, fatback, hot dogs, bratwurst, and packaged luncheon meats. Liver and organ meats. Dairy  Whole or 2% milk, cream, half-and-half, and cream cheese. Whole milk cheeses. Whole-fat or sweetened yogurt. Full-fat cheeses. Nondairy creamers and whipped toppings. Processed cheese, cheese spreads, or cheese curds. Sweets and Desserts  Corn syrup, sugars, honey, and molasses. Candy. Jam and jelly. Syrup. Sweetened cereals. Cookies, pies, cakes, donuts, muffins, and ice cream. Fats and Oils  Butter, stick margarine, lard, shortening, ghee, or bacon fat. Coconut, palm kernel, or palm oils. Beverages  Alcohol. Sweetened drinks (such as sodas, lemonade, and fruit drinks or punches). The items listed above may not be a complete list of foods and beverages to avoid. Contact your dietitian for more information.  This information is not intended to replace advice given to you by your health care provider. Make sure you discuss any questions you have with your health care provider. Document Released: 03/30/2012 Document Revised: 06/05/2016 Document Reviewed: 12/29/2013 Elsevier Interactive Patient Education  2017 Elsevier Inc.  

## 2017-02-19 LAB — CBC WITH DIFFERENTIAL/PLATELET
BASOS ABS: 0 10*3/uL (ref 0.0–0.2)
Basos: 0 %
EOS (ABSOLUTE): 0.1 10*3/uL (ref 0.0–0.4)
Eos: 2 %
HEMOGLOBIN: 12.9 g/dL (ref 11.1–15.9)
Hematocrit: 38.1 % (ref 34.0–46.6)
Immature Grans (Abs): 0 10*3/uL (ref 0.0–0.1)
Immature Granulocytes: 0 %
LYMPHS ABS: 2.3 10*3/uL (ref 0.7–3.1)
Lymphs: 45 %
MCH: 27.7 pg (ref 26.6–33.0)
MCHC: 33.9 g/dL (ref 31.5–35.7)
MCV: 82 fL (ref 79–97)
Monocytes Absolute: 0.3 10*3/uL (ref 0.1–0.9)
Monocytes: 5 %
NEUTROS ABS: 2.5 10*3/uL (ref 1.4–7.0)
Neutrophils: 48 %
PLATELETS: 336 10*3/uL (ref 150–379)
RBC: 4.65 x10E6/uL (ref 3.77–5.28)
RDW: 13.5 % (ref 12.3–15.4)
WBC: 5.2 10*3/uL (ref 3.4–10.8)

## 2017-02-19 LAB — COMPREHENSIVE METABOLIC PANEL
ALT: 21 IU/L (ref 0–32)
AST: 25 IU/L (ref 0–40)
Albumin/Globulin Ratio: 1.7 (ref 1.2–2.2)
Albumin: 4.3 g/dL (ref 3.5–5.5)
Alkaline Phosphatase: 62 IU/L (ref 39–117)
BUN/Creatinine Ratio: 11 (ref 9–23)
BUN: 8 mg/dL (ref 6–24)
Bilirubin Total: 0.5 mg/dL (ref 0.0–1.2)
CALCIUM: 9.6 mg/dL (ref 8.7–10.2)
CO2: 25 mmol/L (ref 18–29)
CREATININE: 0.71 mg/dL (ref 0.57–1.00)
Chloride: 102 mmol/L (ref 96–106)
GFR calc non Af Amer: 97 mL/min/{1.73_m2} (ref >59)
GFR, EST AFRICAN AMERICAN: 112 mL/min/{1.73_m2} (ref >59)
Globulin, Total: 2.6 g/dL (ref 1.5–4.5)
Glucose: 100 mg/dL — ABNORMAL HIGH (ref 65–99)
Potassium: 5 mmol/L (ref 3.5–5.2)
Sodium: 141 mmol/L (ref 134–144)
TOTAL PROTEIN: 6.9 g/dL (ref 6.0–8.5)

## 2017-02-19 LAB — LIPID PANEL
CHOL/HDL RATIO: 3.4 ratio (ref 0.0–4.4)
Cholesterol, Total: 155 mg/dL (ref 100–199)
HDL: 45 mg/dL (ref >39)
LDL CALC: 88 mg/dL (ref 0–99)
TRIGLYCERIDES: 110 mg/dL (ref 0–149)
VLDL CHOLESTEROL CAL: 22 mg/dL (ref 5–40)

## 2017-02-19 LAB — TSH: TSH: 0.408 u[IU]/mL — AB (ref 0.450–4.500)

## 2017-02-19 LAB — HEMOGLOBIN A1C
Est. average glucose Bld gHb Est-mCnc: 126 mg/dL
HEMOGLOBIN A1C: 6 % — AB (ref 4.8–5.6)

## 2017-02-19 LAB — T4, FREE: Free T4: 1.35 ng/dL (ref 0.82–1.77)

## 2017-02-24 ENCOUNTER — Telehealth: Payer: Self-pay | Admitting: Family Medicine

## 2017-02-24 NOTE — Telephone Encounter (Signed)
PATIENT STATES SHE SAW DR. Katrinka BlazingSMITH LAST Wednesday (02/18/17) AND SHE HAS NOT RECEIVED HER LAB RESULTS YET. SHE SAID SHE HAS BEEN LOOKING ON MY-CHART BUT NOTHING HAS BEEN UP LOADED. SHE SAID SHE IS VERY ANXIOUS TO GET HER RESULTS AND SHE WOULD LIKE A CALL BACK AS SOON AS POSSIBLE. BEST PHONE [phone] (CELL) PHARMACY CHOICE IS [facility] ON [address]. MBC

## 2017-02-25 ENCOUNTER — Telehealth: Payer: Self-pay | Admitting: Family Medicine

## 2017-02-25 NOTE — Telephone Encounter (Signed)
Pt is needing to get test strips rx for the one touch ultra mini meter   Best number [phone]

## 2017-02-26 MED ORDER — GLUCOSE BLOOD VI STRP: ORAL_STRIP | 3 refills | Status: DC

## 2017-02-26 NOTE — Telephone Encounter (Signed)
See abnormals and advise 

## 2017-02-28 NOTE — Telephone Encounter (Signed)
Call ---- I have commented on her labs for her and it appears that all lab results have been released to patient. Can you confirm with patient that she can see her lab results and my comments/recommendations.  If not, please review with patient.

## 2017-02-28 NOTE — Telephone Encounter (Signed)
Pt advised of results. 

## 2017-09-09 ENCOUNTER — Other Ambulatory Visit: Payer: Self-pay | Admitting: Family Medicine

## 2017-09-09 DIAGNOSIS — I10 Essential (primary) hypertension: Secondary | ICD-10-CM

## 2017-09-30 ENCOUNTER — Encounter: Payer: Self-pay | Admitting: Family Medicine

## 2017-10-12 ENCOUNTER — Other Ambulatory Visit: Payer: Self-pay

## 2017-10-12 ENCOUNTER — Encounter: Payer: Self-pay | Admitting: Family Medicine

## 2017-10-12 ENCOUNTER — Ambulatory Visit (INDEPENDENT_AMBULATORY_CARE_PROVIDER_SITE_OTHER): Payer: BC Managed Care – PPO | Admitting: Family Medicine

## 2017-10-12 VITALS — BP 116/72 | HR 91 | Temp 97.7°F | Resp 16 | Ht 63.19 in | Wt 165.0 lb

## 2017-10-12 DIAGNOSIS — I83893 Varicose veins of bilateral lower extremities with other complications: Secondary | ICD-10-CM | POA: Diagnosis not present

## 2017-10-12 DIAGNOSIS — I10 Essential (primary) hypertension: Secondary | ICD-10-CM

## 2017-10-12 DIAGNOSIS — Z0000 Encounter for general adult medical examination without abnormal findings: Secondary | ICD-10-CM | POA: Diagnosis not present

## 2017-10-12 DIAGNOSIS — E034 Atrophy of thyroid (acquired): Secondary | ICD-10-CM

## 2017-10-12 DIAGNOSIS — E785 Hyperlipidemia, unspecified: Secondary | ICD-10-CM

## 2017-10-12 DIAGNOSIS — R7303 Prediabetes: Secondary | ICD-10-CM | POA: Diagnosis not present

## 2017-10-12 DIAGNOSIS — E669 Obesity, unspecified: Secondary | ICD-10-CM

## 2017-10-12 DIAGNOSIS — M722 Plantar fascial fibromatosis: Secondary | ICD-10-CM

## 2017-10-12 DIAGNOSIS — L989 Disorder of the skin and subcutaneous tissue, unspecified: Secondary | ICD-10-CM

## 2017-10-12 LAB — POCT URINALYSIS DIP (MANUAL ENTRY)
BILIRUBIN UA: NEGATIVE
BILIRUBIN UA: NEGATIVE mg/dL
GLUCOSE UA: NEGATIVE mg/dL
Leukocytes, UA: NEGATIVE
Nitrite, UA: NEGATIVE
PH UA: 5.5 (ref 5.0–8.0)
Protein Ur, POC: NEGATIVE mg/dL
RBC UA: NEGATIVE
SPEC GRAV UA: 1.01 (ref 1.010–1.025)
Urobilinogen, UA: 0.2 E.U./dL

## 2017-10-12 MED ORDER — LISINOPRIL 20 MG PO TABS: ORAL_TABLET | ORAL | 3 refills | Status: DC

## 2017-10-12 MED ORDER — BUDESONIDE 32 MCG/ACT NA SUSP: 2.0000 | Freq: Every day | NASAL | 11 refills | Status: DC

## 2017-10-12 MED ORDER — ATORVASTATIN CALCIUM 20 MG PO TABS: 20.0000 mg | ORAL_TABLET | Freq: Every day | ORAL | 3 refills | Status: DC

## 2017-10-12 MED ORDER — LEVOTHYROXINE SODIUM 75 MCG PO TABS: 75.0000 ug | ORAL_TABLET | Freq: Every day | ORAL | 3 refills | Status: DC

## 2017-10-12 MED ORDER — FREESTYLE LANCETS MISC: 5 refills | Status: DC

## 2017-10-12 MED ORDER — GLUCOSE BLOOD VI STRP: ORAL_STRIP | 3 refills | Status: DC

## 2017-10-12 MED ORDER — IPRATROPIUM BROMIDE 0.03 % NA SOLN: 2.0000 | Freq: Two times a day (BID) | NASAL | 5 refills | Status: DC

## 2017-10-12 NOTE — Patient Instructions (Addendum)
IF you received an x-ray today, you will receive an invoice from [facility]. Please contact [facility] at [phone] with questions or concerns regarding your invoice.   IF you received labwork today, you will receive an invoice from [facility]. Please contact [facility] at [phone] with questions or concerns regarding your invoice.   Our billing staff will not be able to assist you with questions regarding bills from these companies.  You will be contacted with the lab results as soon as they are available. The fastest way to get your results is to activate your My Chart account. Instructions are located on the last page of this paperwork. If you have not heard from [place] regarding the results in 2 weeks, please contact this office.      Preventive Care 40-64 Years, Female Preventive care refers to lifestyle choices and visits with your health care provider that can promote health and wellness. What does preventive care include?  A yearly physical exam. This is also called an annual well check.  Dental exams once or twice a year.  Routine eye exams. Ask your health care provider how often you should have your eyes checked.  Personal lifestyle choices, including: ? Daily care of your teeth and gums. ? Regular physical activity. ? Eating a healthy diet. ? Avoiding tobacco and drug use. ? Limiting alcohol use. ? Practicing safe sex. ? Taking low-dose aspirin daily starting at age 34. ? Taking vitamin and mineral supplements as recommended by your health care provider. What happens during an annual well check? The services and screenings done by your health care provider during your annual well check will depend on your age, overall health, lifestyle risk factors, and family history of disease. Counseling Your health care provider may ask you questions about your:  Alcohol use.  Tobacco use.  Drug use.  Emotional well-being.  Home and relationship  well-being.  Sexual activity.  Eating habits.  Work and work Statistician.  Method of birth control.  Menstrual cycle.  Pregnancy history.  Screening You may have the following tests or measurements:  Height, weight, and BMI.  Blood pressure.  Lipid and cholesterol levels. These may be checked every 5 years, or more frequently if you are over 65 years old.  Skin check.  Lung cancer screening. You may have this screening every year starting at age 102 if you have a 30-pack-year history of smoking and currently smoke or have quit within the past 15 years.  Fecal occult blood test (FOBT) of the stool. You may have this test every year starting at age 33.  Flexible sigmoidoscopy or colonoscopy. You may have a sigmoidoscopy every 5 years or a colonoscopy every 10 years starting at age 23.  Hepatitis C blood test.  Hepatitis B blood test.  Sexually transmitted disease (STD) testing.  Diabetes screening. This is done by checking your blood sugar (glucose) after you have not eaten for a while (fasting). You may have this done every 1-3 years.  Mammogram. This may be done every 1-2 years. Talk to your health care provider about when you should start having regular mammograms. This may depend on whether you have a family history of breast cancer.  BRCA-related cancer screening. This may be done if you have a family history of breast, ovarian, tubal, or peritoneal cancers.  Pelvic exam and Pap test. This may be done every 3 years starting at age 3. Starting at age 55, this may be done every 5 years if  you have a Pap test in combination with an HPV test.  Bone density scan. This is done to screen for osteoporosis. You may have this scan if you are at high risk for osteoporosis.  Discuss your test results, treatment options, and if necessary, the need for more tests with your health care provider. Vaccines Your health care provider may recommend certain vaccines, such  as:  Influenza vaccine. This is recommended every year.  Tetanus, diphtheria, and acellular pertussis (Tdap, Td) vaccine. You may need a Td booster every 10 years.  Varicella vaccine. You may need this if you have not been vaccinated.  Zoster vaccine. You may need this after age 77.  Measles, mumps, and rubella (MMR) vaccine. You may need at least one dose of MMR if you were born in 1957 or later. You may also need a second dose.  Pneumococcal 13-valent conjugate (PCV13) vaccine. You may need this if you have certain conditions and were not previously vaccinated.  Pneumococcal polysaccharide (PPSV23) vaccine. You may need one or two doses if you smoke cigarettes or if you have certain conditions.  Meningococcal vaccine. You may need this if you have certain conditions.  Hepatitis A vaccine. You may need this if you have certain conditions or if you travel or work in places where you may be exposed to hepatitis A.  Hepatitis B vaccine. You may need this if you have certain conditions or if you travel or work in places where you may be exposed to hepatitis B.  Haemophilus influenzae type b (Hib) vaccine. You may need this if you have certain conditions.  Talk to your health care provider about which screenings and vaccines you need and how often you need them. This information is not intended to replace advice given to you by your health care provider. Make sure you discuss any questions you have with your health care provider. Document Released: 10/26/2015 Document Revised: 06/18/2016 Document Reviewed: 07/31/2015 Elsevier Interactive Patient Education  [employer].

## 2017-10-12 NOTE — Progress Notes (Signed)
Subjective:    Patient ID: Alice Anderson, female    DOB: 26-Nov-1960, 56 y.o.   MRN: [id number]  10/12/2017  Annual Exam    HPI This 56 y.o. female presents for Complete Physical Examination.  Last physical:  08-22-2016 Pap smear:   11/10/2014 [facility] Quan; no abnormal pap smear Mammogram: [facility] 2018 Colonoscopy: [facility] 2016 negative  Plantar fasciitis: went to [facility]; can walk 3 miles; afterwards, pain is horrible.    Onset six weeks.  Was barefooted; now has cushion slippers.    Varicose veins: did not help with walking; pain in varicose veins with prolonged walking.  Glucose intolerance: checking sugars; fasting sugars 98.  Skin lesions: requesting referral to dermatology for further evaluation.    Visual Acuity Screening   Right eye Left eye Both eyes  Without correction:     With correction: '20/15 20/15 20/15 '    BP Readings from Last 3 Encounters:  10/12/17 116/72  02/18/17 109/74  08/22/16 110/64   Wt Readings from Last 3 Encounters:  10/12/17 165 lb (74.8 kg)  02/18/17 161 lb 9.6 oz (73.3 kg)  08/22/16 161 lb (73 kg)   Immunization History  Administered Date(s) Administered  . Influenza Split 06/17/2012, 06/13/2014, 07/01/2014  . Influenza,inj,Quad PF,6+ Mos 07/14/2013  . Influenza-Unspecified 06/14/2015, 07/13/2017  . Pneumococcal Polysaccharide-23 08/22/2016  . Tdap 03/14/2011   Health Maintenance  Topic Date Due  . FOOT EXAM  01/14/2017  . PAP SMEAR  11/10/2017  . MAMMOGRAM  04/02/2018  . HEMOGLOBIN A1C  04/11/2018  . OPHTHALMOLOGY EXAM  08/03/2018  . TETANUS/TDAP  03/13/2021  . PNEUMOCOCCAL POLYSACCHARIDE VACCINE (2) 08/22/2021  . COLONOSCOPY  08/25/2025  . INFLUENZA VACCINE  Completed  . Hepatitis C Screening  Completed  . HIV Screening  Completed    Review of Systems  Constitutional: Negative for activity change, appetite change, chills, diaphoresis, fatigue, fever and unexpected weight change.  HENT: Positive for congestion and sinus  pressure. Negative for dental problem, drooling, ear discharge, ear pain, facial swelling, hearing loss, mouth sores, nosebleeds, postnasal drip, rhinorrhea, sneezing, sore throat, tinnitus, trouble swallowing and voice change.   Eyes: Negative for photophobia, pain, discharge, redness, itching and visual disturbance.  Respiratory: Negative for apnea, cough, choking, chest tightness, shortness of breath, wheezing and stridor.   Cardiovascular: Negative for chest pain, palpitations and leg swelling.  Gastrointestinal: Negative for abdominal distention, abdominal pain, anal bleeding, blood in stool, constipation, diarrhea, nausea, rectal pain and vomiting.  Endocrine: Negative for cold intolerance, heat intolerance, polydipsia, polyphagia and polyuria.  Genitourinary: Negative for decreased urine volume, difficulty urinating, dyspareunia, dysuria, enuresis, flank pain, frequency, genital sores, hematuria, menstrual problem, pelvic pain, urgency, vaginal bleeding, vaginal discharge and vaginal pain.       Nocturia x 1.  No urinary leakage  Musculoskeletal: Negative for arthralgias, back pain, gait problem, joint swelling, myalgias, neck pain and neck stiffness.  Skin: Negative for color change, pallor, rash and wound.  Allergic/Immunologic: Positive for environmental allergies. Negative for food allergies and immunocompromised state.  Neurological: Negative for dizziness, tremors, seizures, syncope, facial asymmetry, speech difficulty, weakness, light-headedness, numbness and headaches.  Hematological: Negative for adenopathy. Does not bruise/bleed easily.  Psychiatric/Behavioral: Negative for agitation, behavioral problems, confusion, decreased concentration, dysphoric mood, hallucinations, self-injury, sleep disturbance and suicidal ideas. The patient is not nervous/anxious and is not hyperactive.        Bedtime 1000; wakes up 600.    Past Medical History:  Diagnosis Date  . Allergy    seasona  .  Family history of early CAD 03/27/2016  . Hyperlipidemia   . Hypertension   . Thyroid disease    Past Surgical History:  Procedure Laterality Date  . [place]   x2   No Known Allergies Current Outpatient Medications on File Prior to Visit  Medication Sig Dispense Refill  . Fexofenadine HCl (ALLEGRA PO) Take by mouth daily.    Marland Kitchen glucose monitoring kit (FREESTYLE) monitoring kit 1 each by Does not apply route as needed for other. 1 each 0   No current facility-administered medications on file prior to visit.    Social History   Socioeconomic History  . Marital status: Married    Spouse name: Not on file  . Number of children: 2  . Years of education: college  . Highest education level: Not on file  Social Needs  . Financial resource strain: Not on file  . Food insecurity - worry: Not on file  . Food insecurity - inability: Not on file  . Transportation needs - medical: Not on file  . Transportation needs - non-medical: Not on file  Occupational History  . Occupation: Product manager: [employer]    Comment: Math  Tobacco Use  . Smoking status: Never Smoker  . Smokeless tobacco: Never Used  Substance and Sexual Activity  . Alcohol use: No  . Drug use: No  . Sexual activity: Yes    Birth control/protection: Condom  Other Topics Concern  . Not on file  Social History Narrative   Marital status:  Married x 1992.      Children: 2 children (21, 19). 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      Exercising: once per week in 2018   Family History  Problem Relation Age of Onset  . Hypothyroidism Mother 71  . Heart disease Mother 47       chf  . Heart disease Father 10       AMI  . Heart attack Father   . Diabetes Sister   . Thyroid disease Sister   . Hyperlipidemia Sister   . Hypertension Brother   . Hyperlipidemia Brother     . Colon cancer Neg Hx   . Rectal cancer Neg Hx   . Stomach cancer Neg Hx        Objective:    BP 116/72   Pulse 91   Temp 97.7 F (36.5 C) (Oral)   Resp 16   Ht 5' 3.19" (1.605 m)   Wt 165 lb (74.8 kg)   SpO2 98%   BMI 29.05 kg/m  Physical Exam  Constitutional: She is oriented to person, place, and time. She appears well-developed and well-nourished. No distress.  HENT:  Head: Normocephalic and atraumatic.  Right Ear: External ear normal.  Left Ear: External ear normal.  Nose: Nose normal.  Mouth/Throat: Oropharynx is clear and moist.  Eyes: Conjunctivae and EOM are normal. Pupils are equal, round, and reactive to light.  Neck: Normal range of motion and full passive range of motion without pain. Neck supple. No JVD present. Carotid bruit is not present. No thyromegaly present.  Cardiovascular: Normal rate, regular rhythm and normal heart sounds. Exam reveals no gallop and no friction rub.  No murmur heard. Varicosities present bilateral lower extremities.  Pulmonary/Chest: Effort normal and breath sounds normal. She has no  wheezes. She has no rales. Right breast exhibits no inverted nipple, no mass, no nipple discharge, no skin change and no tenderness. Left breast exhibits no inverted nipple, no mass, no nipple discharge, no skin change and no tenderness. Breasts are symmetrical.  Abdominal: Soft. Bowel sounds are normal. She exhibits no distension and no mass. There is no tenderness. There is no rebound and no guarding.  Musculoskeletal:       Right shoulder: Normal.       Left shoulder: Normal.       Cervical back: Normal.       Right foot: There is tenderness and bony tenderness. There is normal range of motion and no swelling.  Right foot: Positive tenderness to palpation along calcaneus region.  Full range of motion of right foot and ankle.  Gait is normal.  Lymphadenopathy:    She has no cervical adenopathy.  Neurological: She is alert and oriented to person, place,  and time. She has normal reflexes. No cranial nerve deficit. She exhibits normal muscle tone. Coordination normal.  Skin: Skin is warm and dry. No rash noted. She is not diaphoretic. No erythema. No pallor.  Psychiatric: She has a normal mood and affect. Her behavior is normal. Judgment and thought content normal.  Nursing note and vitals reviewed.  No results found. Depression screen [facility] 2/9 10/12/2017 02/18/2017 08/22/2016 01/15/2016 11/16/2015  Decreased Interest 0 0 0 0 0  Down, Depressed, Hopeless 0 0 0 0 0  PHQ - 2 Score 0 0 0 0 0   Fall Risk  10/12/2017 02/18/2017 08/22/2016 01/15/2016 08/08/2015  Falls in the past year? No No No No No        Assessment & Plan:   1. Routine physical examination   2. HTN, goal below 140/90   3. Hypothyroidism due to acquired atrophy of thyroid   4. Hyperlipidemia with target LDL less than 100   5. Obesity (BMI 30.0-34.9)   6. Pre-diabetes   7. Plantar fasciitis of right foot   8. Varicose veins of bilateral lower extremities with other complications   9. Skin lesion    -anticipatory guidance provided --- exercise, weight loss, safe driving practices, aspirin 7m daily. -obtain age appropriate screening labs and labs for chronic disease management. -recommend weight loss, exercise for 30-60 minutes five days per week; recommend 1200 kcal restriction per day with a minimum of 60 grams of protein per day.  I recommend weight loss, exercise, and low-carbohydrate low-sugar food choices. You should AVOID: regular sodas, sweetened tea, fruit juices.  You should LIMIT: breads, pastas, rice, potatoes, and desserts/sweets.  I would recommend limiting your total carbohydrate intake per meal to 45 grams; I would limit your total carbohydrate intake per snack to 30 grams.  I would also have a goal of 60 grams of protein intake per day; this would equal 10-15 grams of protein per meal and 5-10 grams of protein per snack. -Refer to podiatry due to plantar fasciitis  new onset. -Refer to dermatology to evaluate skin lesions. -Refills provided for all chronic medical conditions.      Orders Placed This Encounter  Procedures  . CBC with Differential/Platelet  . Comprehensive metabolic panel    Order Specific Question:   Has the patient fasted?    Answer:   No  . Hemoglobin A1c  . Lipid panel    Order Specific Question:   Has the patient fasted?    Answer:   No  . TSH  . Ambulatory  referral to Dermatology    Referral Priority:   Routine    Referral Type:   Consultation    Referral Reason:   Specialty Services Required    Requested Specialty:   Dermatology    Number of Visits Requested:   1  . Ambulatory referral to Podiatry    Referral Priority:   Routine    Referral Type:   Consultation    Referral Reason:   Specialty Services Required    Requested Specialty:   Podiatry    Number of Visits Requested:   1  . POCT urinalysis dipstick   Meds ordered this encounter  Medications  . ipratropium (ATROVENT) 0.03 % nasal spray    Sig: Place 2 sprays into both nostrils 2 (two) times daily.    Dispense:  30 mL    Refill:  5  . budesonide (RHINOCORT AQUA) 32 MCG/ACT nasal spray    Sig: Place 2 sprays into both nostrils daily.    Dispense:  8.6 g    Refill:  11  . lisinopril (PRINIVIL,ZESTRIL) 20 MG tablet    Sig: Take 1/2 tablet daily    Dispense:  45 tablet    Refill:  3  . levothyroxine (SYNTHROID, LEVOTHROID) 75 MCG tablet    Sig: Take 1 tablet (75 mcg total) by mouth daily.    Dispense:  90 tablet    Refill:  3  . Lancets (FREESTYLE) lancets    Sig: Check once daily    Dispense:  100 each    Refill:  5  . glucose blood test strip    Sig: Check sugar once daily    Dispense:  100 each    Refill:  3    One touch ultra mini meter  . atorvastatin (LIPITOR) 20 MG tablet    Sig: Take 1 tablet (20 mg total) by mouth at bedtime.    Dispense:  90 tablet    Refill:  3    Return in about 6 months (around 04/11/2018) for follow-up  chronic medical conditions.   Reis Pienta Elayne Guerin, M.D. Primary [facility] at [facility] [address] [place], [place]  [address] [phone] phone [phone] fax

## 2017-10-13 LAB — CBC WITH DIFFERENTIAL/PLATELET
BASOS ABS: 0 10*3/uL (ref 0.0–0.2)
BASOS: 0 %
EOS (ABSOLUTE): 0.1 10*3/uL (ref 0.0–0.4)
Eos: 1 %
Hematocrit: 40.8 % (ref 34.0–46.6)
Hemoglobin: 13.4 g/dL (ref 11.1–15.9)
IMMATURE GRANULOCYTES: 0 %
Immature Grans (Abs): 0 10*3/uL (ref 0.0–0.1)
LYMPHS: 42 %
Lymphocytes Absolute: 2.3 10*3/uL (ref 0.7–3.1)
MCH: 28.1 pg (ref 26.6–33.0)
MCHC: 32.8 g/dL (ref 31.5–35.7)
MCV: 86 fL (ref 79–97)
MONOS ABS: 0.3 10*3/uL (ref 0.1–0.9)
Monocytes: 6 %
NEUTROS PCT: 51 %
Neutrophils Absolute: 2.7 10*3/uL (ref 1.4–7.0)
Platelets: 350 10*3/uL (ref 150–379)
RBC: 4.77 x10E6/uL (ref 3.77–5.28)
RDW: 13.1 % (ref 12.3–15.4)
WBC: 5.4 10*3/uL (ref 3.4–10.8)

## 2017-10-13 LAB — COMPREHENSIVE METABOLIC PANEL
A/G RATIO: 1.8 (ref 1.2–2.2)
ALT: 31 IU/L (ref 0–32)
AST: 30 IU/L (ref 0–40)
Albumin: 4.7 g/dL (ref 3.5–5.5)
Alkaline Phosphatase: 70 IU/L (ref 39–117)
BILIRUBIN TOTAL: 0.4 mg/dL (ref 0.0–1.2)
BUN/Creatinine Ratio: 13 (ref 9–23)
BUN: 9 mg/dL (ref 6–24)
CALCIUM: 9.5 mg/dL (ref 8.7–10.2)
CHLORIDE: 101 mmol/L (ref 96–106)
CO2: 22 mmol/L (ref 20–29)
Creatinine, Ser: 0.71 mg/dL (ref 0.57–1.00)
GFR calc Af Amer: 111 mL/min/{1.73_m2} (ref >59)
GFR calc non Af Amer: 96 mL/min/{1.73_m2} (ref >59)
GLUCOSE: 90 mg/dL (ref 65–99)
Globulin, Total: 2.6 g/dL (ref 1.5–4.5)
POTASSIUM: 4.7 mmol/L (ref 3.5–5.2)
Sodium: 140 mmol/L (ref 134–144)
Total Protein: 7.3 g/dL (ref 6.0–8.5)

## 2017-10-13 LAB — TSH: TSH: 1.17 u[IU]/mL (ref 0.450–4.500)

## 2017-10-13 LAB — LIPID PANEL
CHOLESTEROL TOTAL: 154 mg/dL (ref 100–199)
Chol/HDL Ratio: 3.5 ratio (ref 0.0–4.4)
HDL: 44 mg/dL (ref >39)
LDL Calculated: 86 mg/dL (ref 0–99)
TRIGLYCERIDES: 121 mg/dL (ref 0–149)
VLDL Cholesterol Cal: 24 mg/dL (ref 5–40)

## 2017-10-13 LAB — HEMOGLOBIN A1C
ESTIMATED AVERAGE GLUCOSE: 128 mg/dL
Hgb A1c MFr Bld: 6.1 % — ABNORMAL HIGH (ref 4.8–5.6)

## 2018-03-08 ENCOUNTER — Encounter: Payer: Self-pay | Admitting: Family Medicine

## 2018-04-02 ENCOUNTER — Encounter: Payer: Self-pay | Admitting: Family Medicine

## 2018-04-02 ENCOUNTER — Other Ambulatory Visit: Payer: Self-pay

## 2018-04-02 ENCOUNTER — Ambulatory Visit: Payer: BC Managed Care – PPO | Admitting: Family Medicine

## 2018-04-02 VITALS — BP 104/70 | HR 94 | Temp 98.7°F | Ht 64.88 in | Wt 166.0 lb

## 2018-04-02 DIAGNOSIS — J302 Other seasonal allergic rhinitis: Secondary | ICD-10-CM

## 2018-04-02 DIAGNOSIS — E785 Hyperlipidemia, unspecified: Secondary | ICD-10-CM | POA: Diagnosis not present

## 2018-04-02 DIAGNOSIS — E559 Vitamin D deficiency, unspecified: Secondary | ICD-10-CM

## 2018-04-02 DIAGNOSIS — I10 Essential (primary) hypertension: Secondary | ICD-10-CM | POA: Diagnosis not present

## 2018-04-02 DIAGNOSIS — E039 Hypothyroidism, unspecified: Secondary | ICD-10-CM | POA: Diagnosis not present

## 2018-04-02 DIAGNOSIS — R7303 Prediabetes: Secondary | ICD-10-CM | POA: Diagnosis not present

## 2018-04-02 NOTE — Patient Instructions (Addendum)
Change budesonide to 1 spray per nostril twice a day, continue with daily zyrtec. If not improvement in symptoms after about 2-3 weeks call and I will send a prescription for singulair    IF you received an x-ray today, you will receive an invoice from [facility]. Please contact [facility] at [phone] with questions or concerns regarding your invoice.   IF you received labwork today, you will receive an invoice from [facility]. Please contact [facility] at [phone] with questions or concerns regarding your invoice.   Our billing staff will not be able to assist you with questions regarding bills from these companies.  You will be contacted with the lab results as soon as they are available. The fastest way to get your results is to activate your My Chart account. Instructions are located on the last page of this paperwork. If you have not heard from us regarding the results in 2 weeks, please contact this office.

## 2018-04-02 NOTE — Progress Notes (Signed)
6/21/201911:12 AM  Alice Anderson 13-Mar-1961, 57 y.o. female [id number]  Chief Complaint  Patient presents with  . Follow-up    Chronic conditiions, pre diabetes    HPI:   Patient is a 57 y.o. female with past medical history significant for HTN, HLP, pre-diabtes who presents today for followup  Last seen dec 2018  Taking all meds as prescribed Walks a mile every day, goes to [employer] once a week. Her goal is to increase to twice a week She has eliminated white starches from her diet, has decreased use of sugar, does not drink juice, sodas, etc Last LDL at goal, a1c 6.1 Checks cbgs about twice a week, fasting, < 100 Allergies not well controlled, using budesonide nasal spray in the AM and zyrtec at PM Cant be outside for long periods of time or she starts getting symptoms  Fall Risk  04/02/2018 10/12/2017 02/18/2017 08/22/2016 01/15/2016  Falls in the past year? No No No No No     Depression screen [facility] 2/9 04/02/2018 10/12/2017 02/18/2017  Decreased Interest 0 0 0  Down, Depressed, Hopeless 0 0 0  PHQ - 2 Score 0 0 0    No Known Allergies  Prior to Admission medications   Medication Sig Start Date End Date Taking? Authorizing Provider  atorvastatin (LIPITOR) 20 MG tablet Take 1 tablet (20 mg total) by mouth at bedtime. 10/12/17  Yes Wardell Honour, MD  budesonide (RHINOCORT AQUA) 32 MCG/ACT nasal spray Place 2 sprays into both nostrils daily. 10/12/17  Yes Wardell Honour, MD  Fexofenadine HCl (ALLEGRA PO) Take by mouth daily.   Yes [provider]  glucose blood test strip Check sugar once daily 10/12/17  Yes Wardell Honour, MD  glucose monitoring kit (FREESTYLE) monitoring kit 1 each by Does not apply route as needed for other. 02/18/17  Yes Wardell Honour, MD  ipratropium (ATROVENT) 0.03 % nasal spray Place 2 sprays into both nostrils 2 (two) times daily. 10/12/17  Yes Wardell Honour, MD  Lancets (FREESTYLE) lancets Check once daily 10/12/17  Yes Wardell Honour, MD    levothyroxine (SYNTHROID, LEVOTHROID) 75 MCG tablet Take 1 tablet (75 mcg total) by mouth daily. 10/12/17  Yes Wardell Honour, MD  lisinopril (PRINIVIL,ZESTRIL) 20 MG tablet Take 1/2 tablet daily 10/12/17  Yes Wardell Honour, MD    Past Medical History:  Diagnosis Date  . Allergy    seasona  . Family history of early CAD 03/27/2016  . Hyperlipidemia   . Hypertension   . Thyroid disease     Past Surgical History:  Procedure Laterality Date  . [place]   x2    Social History   Tobacco Use  . Smoking status: Never Smoker  . Smokeless tobacco: Never Used  Substance Use Topics  . Alcohol use: No    Family History  Problem Relation Age of Onset  . Hypothyroidism Mother 31  . Heart disease Mother 76       chf  . Heart disease Father 55       AMI  . Heart attack Father   . Diabetes Sister   . Thyroid disease Sister   . Hyperlipidemia Sister   . Hypertension Brother   . Hyperlipidemia Brother   . Colon cancer Neg Hx   . Rectal cancer Neg Hx   . Stomach cancer Neg Hx     Review of Systems  Constitutional: Negative for chills and fever.  HENT:  Positive for congestion. Negative for ear pain, sinus pain and sore throat.   Respiratory: Negative for cough and shortness of breath.   Cardiovascular: Negative for chest pain, palpitations and leg swelling.  Gastrointestinal: Negative for abdominal pain, nausea and vomiting.  Endo/Heme/Allergies: Negative for polydipsia.     OBJECTIVE:  Blood pressure 104/70, pulse 94, temperature 98.7 F (37.1 C), temperature source Oral, height 5' 4.88" (1.648 m), weight 166 lb (75.3 kg), SpO2 98 %.  Wt Readings from Last 3 Encounters:  04/02/18 166 lb (75.3 kg)  10/12/17 165 lb (74.8 kg)  02/18/17 161 lb 9.6 oz (73.3 kg)   BP Readings from Last 3 Encounters:  04/02/18 104/70  10/12/17 116/72  02/18/17 109/74    Physical Exam  Constitutional: She is oriented to person, place, and time. She appears  well-developed and well-nourished.  HENT:  Head: Normocephalic and atraumatic.  Right Ear: Hearing, tympanic membrane, external ear and ear canal normal.  Left Ear: Hearing, tympanic membrane, external ear and ear canal normal.  Mouth/Throat: Oropharynx is clear and moist.  Eyes: Pupils are equal, round, and reactive to light. Conjunctivae and EOM are normal.  Neck: Neck supple.  Cardiovascular: Normal rate, regular rhythm and normal heart sounds. Exam reveals no gallop and no friction rub.  No murmur heard. Pulmonary/Chest: Effort normal and breath sounds normal. She has no wheezes. She has no rales.  Lymphadenopathy:    She has no cervical adenopathy.  Neurological: She is alert and oriented to person, place, and time.  Skin: Skin is warm and dry.  Psychiatric: She has a normal mood and affect.  Nursing note and vitals reviewed.   ASSESSMENT and PLAN  1. Essential hypertension, benign 2. Hyperlipidemia, unspecified hyperlipidemia type 4. Prediabetes Controlled. Continue current regime. Checking labs today. Continue with LFM. - CBC with Differential/Platelet - CMP14+EGFR - Lipid panel - Hemoglobin A1c  3. Hypothyroidism, unspecified type  Controlled. Continue current regime. Checking labs today. - TSH  5. Vitamin D deficiency Has not been rechecked, since 2017, not on supplement - Vitamin D, 25-hydroxy  6. Seasonal allergies Not controlled. Increase budesonide to BID. Consider adding singulair  Return in about 6 months (around 10/02/2018) for WWE with pap.    Rutherford Guys, MD Primary Care at [place], [place] [address] Ph.  [phone] Fax [phone]

## 2018-04-03 LAB — CBC WITH DIFFERENTIAL/PLATELET
Basophils Absolute: 0 10*3/uL (ref 0.0–0.2)
Basos: 0 %
EOS (ABSOLUTE): 0.1 10*3/uL (ref 0.0–0.4)
Eos: 1 %
Hematocrit: 40.1 % (ref 34.0–46.6)
Hemoglobin: 13.4 g/dL (ref 11.1–15.9)
Immature Grans (Abs): 0 10*3/uL (ref 0.0–0.1)
Immature Granulocytes: 0 %
Lymphocytes Absolute: 2.6 10*3/uL (ref 0.7–3.1)
Lymphs: 50 %
MCH: 28.3 pg (ref 26.6–33.0)
MCHC: 33.4 g/dL (ref 31.5–35.7)
MCV: 85 fL (ref 79–97)
Monocytes Absolute: 0.4 10*3/uL (ref 0.1–0.9)
Monocytes: 7 %
Neutrophils Absolute: 2.2 10*3/uL (ref 1.4–7.0)
Neutrophils: 42 %
Platelets: 316 10*3/uL (ref 150–450)
RBC: 4.73 x10E6/uL (ref 3.77–5.28)
RDW: 13.3 % (ref 12.3–15.4)
WBC: 5.2 10*3/uL (ref 3.4–10.8)

## 2018-04-03 LAB — LIPID PANEL
Chol/HDL Ratio: 2.9 ratio (ref 0.0–4.4)
Cholesterol, Total: 131 mg/dL (ref 100–199)
HDL: 45 mg/dL (ref >39)
LDL Calculated: 67 mg/dL (ref 0–99)
Triglycerides: 96 mg/dL (ref 0–149)
VLDL Cholesterol Cal: 19 mg/dL (ref 5–40)

## 2018-04-03 LAB — CMP14+EGFR
ALT: 30 IU/L (ref 0–32)
AST: 28 IU/L (ref 0–40)
Albumin/Globulin Ratio: 1.7 (ref 1.2–2.2)
Albumin: 4.4 g/dL (ref 3.5–5.5)
Alkaline Phosphatase: 68 IU/L (ref 39–117)
BUN/Creatinine Ratio: 11 (ref 9–23)
BUN: 8 mg/dL (ref 6–24)
Bilirubin Total: 0.4 mg/dL (ref 0.0–1.2)
CO2: 25 mmol/L (ref 20–29)
Calcium: 9.5 mg/dL (ref 8.7–10.2)
Chloride: 101 mmol/L (ref 96–106)
Creatinine, Ser: 0.72 mg/dL (ref 0.57–1.00)
GFR calc Af Amer: 108 mL/min/{1.73_m2} (ref >59)
GFR calc non Af Amer: 94 mL/min/{1.73_m2} (ref >59)
Globulin, Total: 2.6 g/dL (ref 1.5–4.5)
Glucose: 100 mg/dL — ABNORMAL HIGH (ref 65–99)
Potassium: 4.9 mmol/L (ref 3.5–5.2)
Sodium: 140 mmol/L (ref 134–144)
Total Protein: 7 g/dL (ref 6.0–8.5)

## 2018-04-03 LAB — HEMOGLOBIN A1C
Est. average glucose Bld gHb Est-mCnc: 131 mg/dL
Hgb A1c MFr Bld: 6.2 % — ABNORMAL HIGH (ref 4.8–5.6)

## 2018-04-03 LAB — TSH: TSH: 2.26 u[IU]/mL (ref 0.450–4.500)

## 2018-04-03 LAB — VITAMIN D 25 HYDROXY (VIT D DEFICIENCY, FRACTURES): Vit D, 25-Hydroxy: 49 ng/mL (ref 30.0–100.0)

## 2018-04-05 ENCOUNTER — Ambulatory Visit: Payer: Self-pay | Admitting: Family Medicine

## 2018-04-10 LAB — HM MAMMOGRAPHY

## 2018-05-06 ENCOUNTER — Encounter: Payer: Self-pay | Admitting: *Deleted

## 2018-09-17 ENCOUNTER — Other Ambulatory Visit: Payer: Self-pay

## 2018-09-17 ENCOUNTER — Encounter: Payer: Self-pay | Admitting: Family Medicine

## 2018-09-17 ENCOUNTER — Ambulatory Visit (INDEPENDENT_AMBULATORY_CARE_PROVIDER_SITE_OTHER): Payer: BC Managed Care – PPO | Admitting: Family Medicine

## 2018-09-17 VITALS — BP 121/82 | HR 79 | Temp 98.6°F | Ht 64.88 in | Wt 166.8 lb

## 2018-09-17 DIAGNOSIS — E034 Atrophy of thyroid (acquired): Secondary | ICD-10-CM | POA: Diagnosis not present

## 2018-09-17 DIAGNOSIS — E785 Hyperlipidemia, unspecified: Secondary | ICD-10-CM | POA: Diagnosis not present

## 2018-09-17 DIAGNOSIS — I10 Essential (primary) hypertension: Secondary | ICD-10-CM | POA: Diagnosis not present

## 2018-09-17 DIAGNOSIS — R7303 Prediabetes: Secondary | ICD-10-CM

## 2018-09-17 MED ORDER — ATORVASTATIN CALCIUM 20 MG PO TABS: 20.0000 mg | ORAL_TABLET | Freq: Every day | ORAL | 3 refills | Status: AC

## 2018-09-17 MED ORDER — LEVOTHYROXINE SODIUM 75 MCG PO TABS: 75.0000 ug | ORAL_TABLET | Freq: Every day | ORAL | 3 refills | Status: DC

## 2018-09-17 MED ORDER — GLUCOSE BLOOD VI STRP: ORAL_STRIP | 3 refills | Status: AC

## 2018-09-17 MED ORDER — FREESTYLE LANCETS MISC: 5 refills | Status: AC

## 2018-09-17 MED ORDER — LISINOPRIL 20 MG PO TABS: ORAL_TABLET | ORAL | 3 refills | Status: DC

## 2018-09-17 NOTE — Patient Instructions (Addendum)
   If you have lab work done today you will be contacted with your lab results within the next 2 weeks.  If you have not heard from us then please contact us. The fastest way to get your results is to register for My Chart.   IF you received an x-ray today, you will receive an invoice from [facility]. Please contact [facility] at [phone] with questions or concerns regarding your invoice.   IF you received labwork today, you will receive an invoice from [facility]. Please contact [facility] at [phone] with questions or concerns regarding your invoice.   Our billing staff will not be able to assist you with questions regarding bills from these companies.  You will be contacted with the lab results as soon as they are available. The fastest way to get your results is to activate your My Chart account. Instructions are located on the last page of this paperwork. If you have not heard from us regarding the results in 2 weeks, please contact this office.    Prediabetes Eating Plan Prediabetes-also called impaired glucose tolerance or impaired fasting glucose-is a condition that causes blood sugar (blood glucose) levels to be higher than normal. Following a healthy diet can help to keep prediabetes under control. It can also help to lower the risk of type 2 diabetes and heart disease, which are increased in people who have prediabetes. Along with regular exercise, a healthy diet:  Promotes weight loss.  Helps to control blood sugar levels.  Helps to improve the way that the body uses insulin.  What do I need to know about this eating plan?  Use the glycemic index (GI) to plan your meals. The index tells you how quickly a food will raise your blood sugar. Choose low-GI foods. These foods take a longer time to raise blood sugar.  Pay close attention to the amount of carbohydrates in the food that you eat. Carbohydrates increase blood sugar levels.  Keep track of how  many calories you take in. Eating the right amount of calories will help you to achieve a healthy weight. Losing about 7 percent of your starting weight can help to prevent type 2 diabetes.  You may want to follow a Mediterranean diet. This diet includes a lot of vegetables, lean meats or fish, whole grains, fruits, and healthy oils and fats. What foods can I eat? Grains Whole grains, such as whole-wheat or whole-grain breads, crackers, cereals, and pasta. Unsweetened oatmeal. Bulgur. Barley. Quinoa. Brown rice. Corn or whole-wheat flour tortillas or taco shells. Vegetables Lettuce. Spinach. Peas. Beets. Cauliflower. Cabbage. Broccoli. Carrots. Tomatoes. Squash. Eggplant. Herbs. Peppers. Onions. Cucumbers. Brussels sprouts. Fruits Berries. Bananas. Apples. Oranges. Grapes. Papaya. Mango. Pomegranate. Kiwi. Grapefruit. Cherries. Meats and Other Protein Sources Seafood. Lean meats, such as chicken and turkey or lean cuts of pork and beef. Tofu. Eggs. Nuts. Beans. Dairy Low-fat or fat-free dairy products, such as yogurt, cottage cheese, and cheese. Beverages Water. Tea. Coffee. Sugar-free or diet soda. Seltzer water. Milk. Milk alternatives, such as soy or almond milk. Condiments Mustard. Relish. Low-fat, low-sugar ketchup. Low-fat, low-sugar barbecue sauce. Low-fat or fat-free mayonnaise. Sweets and Desserts Sugar-free or low-fat pudding. Sugar-free or low-fat ice cream and other frozen treats. Fats and Oils Avocado. Walnuts. Olive oil. The items listed above may not be a complete list of recommended foods or beverages. Contact your dietitian for more options. What foods are not recommended? Grains Refined white flour and flour products, such as bread, pasta, snack foods,   and cereals. Beverages Sweetened drinks, such as sweet iced tea and soda. Sweets and Desserts Baked goods, such as cake, cupcakes, pastries, cookies, and cheesecake. The items listed above may not be a complete list of  foods and beverages to avoid. Contact your dietitian for more information. This information is not intended to replace advice given to you by your health care provider. Make sure you discuss any questions you have with your health care provider. Document Released: 02/13/2015 Document Revised: 03/06/2016 Document Reviewed: 10/25/2014 Elsevier Interactive Patient Education  2017 Elsevier Inc.  

## 2018-09-17 NOTE — Progress Notes (Signed)
12/6/201910:26 AM  Alice Anderson 09-Feb-1961, 57 y.o. female [id number]  Chief Complaint  Patient presents with  . Hypertension    also prediabetic follow up, no medication refilled at this time  . Hypothyroidism    HPI:   Patient is a 57 y.o. female with past medical history significant for HTN, HLP, hypothyroidism, pre-diabtes who presents today for followup  Last OV in June 2019 Has been feeling more fatigued Not sure if work related Feels she is losing more hair than normal  She tries to exercise often Walks an average of a mile and half while at work  Has occasional flare up of varicose veins She does wear compression stocking Has seen vein specialist  Checks blood glucose several times a week, not daily Denies any lows or highs Highest fasting 130 Usually 118-120  Lab Results  Component Value Date   HGBA1C 6.2 (H) 04/02/2018   HGBA1C 6.1 (H) 10/12/2017   HGBA1C 6.0 (H) 02/18/2017   Lab Results  Component Value Date   LDLCALC 67 04/02/2018   CREATININE 0.72 04/02/2018   Lab Results  Component Value Date   TSH 2.260 04/02/2018    Fall Risk  09/17/2018 04/02/2018 10/12/2017 02/18/2017 08/22/2016  Falls in the past year? 0 No No No No     Depression screen [facility] 2/9 09/17/2018 04/02/2018 10/12/2017  Decreased Interest 0 0 0  Down, Depressed, Hopeless 0 0 0  PHQ - 2 Score 0 0 0    No Known Allergies  Prior to Admission medications   Medication Sig Start Date End Date Taking? Authorizing Provider  atorvastatin (LIPITOR) 20 MG tablet Take 1 tablet (20 mg total) by mouth at bedtime. 10/12/17  Yes Wardell Honour, MD  budesonide (RHINOCORT AQUA) 32 MCG/ACT nasal spray Place 2 sprays into both nostrils daily. 10/12/17  Yes Wardell Honour, MD  Fexofenadine HCl (ALLEGRA PO) Take by mouth daily.   Yes [provider]  glucose blood test strip Check sugar once daily 10/12/17  Yes Wardell Honour, MD  glucose monitoring kit (FREESTYLE) monitoring kit 1  each by Does not apply route as needed for other. 02/18/17  Yes Wardell Honour, MD  ipratropium (ATROVENT) 0.03 % nasal spray Place 2 sprays into both nostrils 2 (two) times daily. 10/12/17  Yes Wardell Honour, MD  Lancets (FREESTYLE) lancets Check once daily 10/12/17  Yes Wardell Honour, MD  levothyroxine (SYNTHROID, LEVOTHROID) 75 MCG tablet Take 1 tablet (75 mcg total) by mouth daily. 10/12/17  Yes Wardell Honour, MD  lisinopril (PRINIVIL,ZESTRIL) 20 MG tablet Take 1/2 tablet daily 10/12/17  Yes Wardell Honour, MD    Past Medical History:  Diagnosis Date  . Allergy    seasona  . Family history of early CAD 03/27/2016  . Hyperlipidemia   . Hypertension   . Prediabetes   . Thyroid disease     Past Surgical History:  Procedure Laterality Date  . [place]   x2    Social History   Tobacco Use  . Smoking status: Never Smoker  . Smokeless tobacco: Never Used  Substance Use Topics  . Alcohol use: No    Family History  Problem Relation Age of Onset  . Hypothyroidism Mother 70  . Heart disease Mother 11       chf  . Heart disease Father 60       AMI  . Heart attack Father   . Diabetes Sister   .  Thyroid disease Sister   . Hyperlipidemia Sister   . Hypertension Brother   . Hyperlipidemia Brother   . Colon cancer Neg Hx   . Rectal cancer Neg Hx   . Stomach cancer Neg Hx     Review of Systems  Constitutional: Negative for chills and fever.  Respiratory: Negative for cough and shortness of breath.   Cardiovascular: Negative for chest pain, palpitations and leg swelling.  Gastrointestinal: Negative for abdominal pain, nausea and vomiting.  per hpi   OBJECTIVE:  Blood pressure 121/82, pulse 79, temperature 98.6 F (37 C), temperature source Oral, height 5' 4.88" (1.648 m), weight 166 lb 12.8 oz (75.7 kg), SpO2 95 %. Body mass index is 27.86 kg/m.   BP Readings from Last 3 Encounters:  09/17/18 121/82  04/02/18 104/70  10/12/17 116/72    Wt Readings from Last 3 Encounters:  09/17/18 166 lb 12.8 oz (75.7 kg)  04/02/18 166 lb (75.3 kg)  10/12/17 165 lb (74.8 kg)    Physical Exam  Constitutional: She is oriented to person, place, and time. She appears well-developed and well-nourished.  HENT:  Head: Normocephalic and atraumatic.  Mouth/Throat: Oropharynx is clear and moist. No oropharyngeal exudate.  Eyes: Pupils are equal, round, and reactive to light. Conjunctivae and EOM are normal. No scleral icterus.  Neck: Neck supple. No thyromegaly present.  Cardiovascular: Normal rate, regular rhythm and normal heart sounds. Exam reveals no gallop and no friction rub.  No murmur heard. Pulmonary/Chest: Effort normal and breath sounds normal. She has no wheezes. She has no rales.  Musculoskeletal: She exhibits no edema.  Neurological: She is alert and oriented to person, place, and time.  Skin: Skin is warm and dry.  Psychiatric: She has a normal mood and affect.  Nursing note and vitals reviewed.   ASSESSMENT and PLAN  1. Hypothyroidism due to acquired atrophy of thyroid Checking labs today, medications will be adjusted as needed.  - TSH  2. Pre-diabetes Checking labs today, medications will be adjusted as needed. Discussed importance of low carb diet, regular exercise and healthy weight.  - Hemoglobin A1c  3. Hyperlipidemia with target LDL less than 100 Checking labs today, medications will be adjusted as needed.  - Lipid panel  4. HTN, goal below 140/90 Controlled. Continue current regime.  - CMP14+EGFR - CBC with Differential/Platelet - lisinopril (PRINIVIL,ZESTRIL) 20 MG tablet; Take 1/2 tablet daily  Other orders - atorvastatin (LIPITOR) 20 MG tablet; Take 1 tablet (20 mg total) by mouth at bedtime. - levothyroxine (SYNTHROID, LEVOTHROID) 75 MCG tablet; Take 1 tablet (75 mcg total) by mouth daily. - glucose blood test strip; Check sugar once daily - Lancets (FREESTYLE) lancets; Check once daily  Return  for as scheduled.    Rutherford Guys, MD Primary Care at [place], [place] [address] Ph.  [phone] Fax [phone]

## 2018-09-18 LAB — CMP14+EGFR
ALT: 30 IU/L (ref 0–32)
AST: 33 IU/L (ref 0–40)
Albumin/Globulin Ratio: 1.6 (ref 1.2–2.2)
Albumin: 4.5 g/dL (ref 3.5–5.5)
Alkaline Phosphatase: 74 IU/L (ref 39–117)
BUN/Creatinine Ratio: 12 (ref 9–23)
BUN: 8 mg/dL (ref 6–24)
Bilirubin Total: 0.5 mg/dL (ref 0.0–1.2)
CO2: 22 mmol/L (ref 20–29)
Calcium: 9.6 mg/dL (ref 8.7–10.2)
Chloride: 99 mmol/L (ref 96–106)
Creatinine, Ser: 0.69 mg/dL (ref 0.57–1.00)
GFR calc Af Amer: 113 mL/min/{1.73_m2} (ref >59)
GFR calc non Af Amer: 98 mL/min/{1.73_m2} (ref >59)
Globulin, Total: 2.8 g/dL (ref 1.5–4.5)
Glucose: 94 mg/dL (ref 65–99)
Potassium: 4.7 mmol/L (ref 3.5–5.2)
Sodium: 138 mmol/L (ref 134–144)
Total Protein: 7.3 g/dL (ref 6.0–8.5)

## 2018-09-18 LAB — CBC WITH DIFFERENTIAL/PLATELET
Basophils Absolute: 0 10*3/uL (ref 0.0–0.2)
Basos: 0 %
EOS (ABSOLUTE): 0.1 10*3/uL (ref 0.0–0.4)
Eos: 2 %
Hematocrit: 38.8 % (ref 34.0–46.6)
Hemoglobin: 12.8 g/dL (ref 11.1–15.9)
Immature Grans (Abs): 0 10*3/uL (ref 0.0–0.1)
Immature Granulocytes: 0 %
Lymphocytes Absolute: 2.5 10*3/uL (ref 0.7–3.1)
Lymphs: 45 %
MCH: 27.6 pg (ref 26.6–33.0)
MCHC: 33 g/dL (ref 31.5–35.7)
MCV: 84 fL (ref 79–97)
Monocytes Absolute: 0.3 10*3/uL (ref 0.1–0.9)
Monocytes: 6 %
Neutrophils Absolute: 2.5 10*3/uL (ref 1.4–7.0)
Neutrophils: 47 %
Platelets: 327 10*3/uL (ref 150–450)
RBC: 4.63 x10E6/uL (ref 3.77–5.28)
RDW: 12.4 % (ref 12.3–15.4)
WBC: 5.4 10*3/uL (ref 3.4–10.8)

## 2018-09-18 LAB — LIPID PANEL
Chol/HDL Ratio: 3.1 ratio (ref 0.0–4.4)
Cholesterol, Total: 147 mg/dL (ref 100–199)
HDL: 47 mg/dL (ref >39)
LDL Calculated: 81 mg/dL (ref 0–99)
Triglycerides: 95 mg/dL (ref 0–149)
VLDL Cholesterol Cal: 19 mg/dL (ref 5–40)

## 2018-09-18 LAB — HEMOGLOBIN A1C
Est. average glucose Bld gHb Est-mCnc: 128 mg/dL
Hgb A1c MFr Bld: 6.1 % — ABNORMAL HIGH (ref 4.8–5.6)

## 2018-09-18 LAB — TSH: TSH: 0.88 u[IU]/mL (ref 0.450–4.500)

## 2018-10-05 ENCOUNTER — Encounter: Payer: Self-pay | Admitting: Family Medicine

## 2018-10-05 ENCOUNTER — Other Ambulatory Visit: Payer: Self-pay

## 2018-10-05 ENCOUNTER — Ambulatory Visit (INDEPENDENT_AMBULATORY_CARE_PROVIDER_SITE_OTHER): Payer: BC Managed Care – PPO | Admitting: Family Medicine

## 2018-10-05 VITALS — BP 124/82 | HR 72 | Temp 98.9°F | Ht 64.88 in | Wt 165.5 lb

## 2018-10-05 DIAGNOSIS — Z01419 Encounter for gynecological examination (general) (routine) without abnormal findings: Secondary | ICD-10-CM

## 2018-10-05 DIAGNOSIS — Z8249 Family history of ischemic heart disease and other diseases of the circulatory system: Secondary | ICD-10-CM

## 2018-10-05 DIAGNOSIS — Z124 Encounter for screening for malignant neoplasm of cervix: Secondary | ICD-10-CM

## 2018-10-05 DIAGNOSIS — Z1211 Encounter for screening for malignant neoplasm of colon: Secondary | ICD-10-CM

## 2018-10-05 NOTE — Patient Instructions (Addendum)
Shingrix - against shingles - at pharmacy of choice   Preventive Care 40-64 Years, Female Preventive care refers to lifestyle choices and visits with your health care provider that can promote health and wellness. What does preventive care include?   A yearly physical exam. This is also called an annual well check.  Dental exams once or twice a year.  Routine eye exams. Ask your health care provider how often you should have your eyes checked.  Personal lifestyle choices, including: ? Daily care of your teeth and gums. ? Regular physical activity. ? Eating a healthy diet. ? Avoiding tobacco and drug use. ? Limiting alcohol use. ? Practicing safe sex. ? Taking low-dose aspirin daily starting at age 24. ? Taking vitamin and mineral supplements as recommended by your health care provider. What happens during an annual well check? The services and screenings done by your health care provider during your annual well check will depend on your age, overall health, lifestyle risk factors, and family history of disease. Counseling Your health care provider may ask you questions about your:  Alcohol use.  Tobacco use.  Drug use.  Emotional well-being.  Home and relationship well-being.  Sexual activity.  Eating habits.  Work and work Statistician.  Method of birth control.  Menstrual cycle.  Pregnancy history. Screening You may have the following tests or measurements:  Height, weight, and BMI.  Blood pressure.  Lipid and cholesterol levels. These may be checked every 5 years, or more frequently if you are over 35 years old.  Skin check.  Lung cancer screening. You may have this screening every year starting at age 47 if you have a 30-pack-year history of smoking and currently smoke or have quit within the past 15 years.  Colorectal cancer screening. All adults should have this screening starting at age 61 and continuing until age 39. Your health care provider may  recommend screening at age 45. You will have tests every 1-10 years, depending on your results and the type of screening test. People at increased risk should start screening at an earlier age. Screening tests may include: ? Guaiac-based fecal occult blood testing. ? Fecal immunochemical test (FIT). ? Stool DNA test. ? Virtual colonoscopy. ? Sigmoidoscopy. During this test, a flexible tube with a tiny camera (sigmoidoscope) is used to examine your rectum and lower colon. The sigmoidoscope is inserted through your anus into your rectum and lower colon. ? Colonoscopy. During this test, a long, thin, flexible tube with a tiny camera (colonoscope) is used to examine your entire colon and rectum.  Hepatitis C blood test.  Hepatitis B blood test.  Sexually transmitted disease (STD) testing.  Diabetes screening. This is done by checking your blood sugar (glucose) after you have not eaten for a while (fasting). You may have this done every 1-3 years.  Mammogram. This may be done every 1-2 years. Talk to your health care provider about when you should start having regular mammograms. This may depend on whether you have a family history of breast cancer.  BRCA-related cancer screening. This may be done if you have a family history of breast, ovarian, tubal, or peritoneal cancers.  Pelvic exam and Pap test. This may be done every 3 years starting at age 68. Starting at age 41, this may be done every 5 years if you have a Pap test in combination with an HPV test.  Bone density scan. This is done to screen for osteoporosis. You may have this scan if you are  at high risk for osteoporosis. Discuss your test results, treatment options, and if necessary, the need for more tests with your health care provider. Vaccines Your health care provider may recommend certain vaccines, such as:  Influenza vaccine. This is recommended every year.  Tetanus, diphtheria, and acellular pertussis (Tdap, Td) vaccine. You  may need a Td booster every 10 years.  Varicella vaccine. You may need this if you have not been vaccinated.  Zoster vaccine. You may need this after age 24.  Measles, mumps, and rubella (MMR) vaccine. You may need at least one dose of MMR if you were born in 1957 or later. You may also need a second dose.  Pneumococcal 13-valent conjugate (PCV13) vaccine. You may need this if you have certain conditions and were not previously vaccinated.  Pneumococcal polysaccharide (PPSV23) vaccine. You may need one or two doses if you smoke cigarettes or if you have certain conditions.  Meningococcal vaccine. You may need this if you have certain conditions.  Hepatitis A vaccine. You may need this if you have certain conditions or if you travel or work in places where you may be exposed to hepatitis A.  Hepatitis B vaccine. You may need this if you have certain conditions or if you travel or work in places where you may be exposed to hepatitis B.  Haemophilus influenzae type b (Hib) vaccine. You may need this if you have certain conditions. Talk to your health care provider about which screenings and vaccines you need and how often you need them. This information is not intended to replace advice given to you by your health care provider. Make sure you discuss any questions you have with your health care provider. Document Released: 10/26/2015 Document Revised: 11/19/2017 Document Reviewed: 07/31/2015 Elsevier Interactive Patient Education  [employer].    If you have lab work done today you will be contacted with your lab results within the next 2 weeks.  If you have not heard from [place] then please contact us. The fastest way to get your results is to register for My Chart.   IF you received an x-ray today, you will receive an invoice from [facility]. Please contact [facility] at [phone] with questions or concerns regarding your invoice.   IF you received labwork  today, you will receive an invoice from [facility]. Please contact [facility] at [phone] with questions or concerns regarding your invoice.   Our billing staff will not be able to assist you with questions regarding bills from these companies.  You will be contacted with the lab results as soon as they are available. The fastest way to get your results is to activate your My Chart account. Instructions are located on the last page of this paperwork. If you have not heard from [place] regarding the results in 2 weeks, please contact this office.

## 2018-10-05 NOTE — Progress Notes (Signed)
12/24/20198:26 AM  [name] Jan 09, 1961, 57 y.o. female [id number]  Chief Complaint  Patient presents with  . Annual Exam    HPI:   Patient is a 57 y.o. female with past medical history significant for HTN, HLP, prediabetes, hypothyroidism who presents today for CPE  Last CPE Oct 12 2017 Cervical Cancer Screening: 2016, denies abnormal, LMP 8 years ago Breast Cancer Screening: 2019 Colorectal Cancer Screening: 2016, cologuard Bone Density Testing: at age 21 HIV Screening: 2017 Seasonal Influenza Vaccination: has had this season Td/Tdap Vaccination: 2012 Pneumococcal Vaccination: at age 31 Zoster Vaccination: at pharmacy of choice Frequency of Dental evaluation: Q6 months Frequency of Eye evaluation: yearly, wears eyeglasses  Concerns for fhx CAD, father MI at age 44, died 72 Patient had stress test and cardiac calcium score in 2017, negative  Lab Results  Component Value Date   HGBA1C 6.1 (H) 09/17/2018   HGBA1C 6.2 (H) 04/02/2018   HGBA1C 6.1 (H) 10/12/2017   Lab Results  Component Value Date   LDLCALC 81 09/17/2018   CREATININE 0.69 09/17/2018   Lab Results  Component Value Date   TSH 0.880 09/17/2018    Fall Risk  10/05/2018 09/17/2018 04/02/2018 10/12/2017 02/18/2017  Falls in the past year? 0 0 No No No     Depression screen [facility] 2/9 10/05/2018 09/17/2018 04/02/2018  Decreased Interest 0 0 0  Down, Depressed, Hopeless 0 0 0  PHQ - 2 Score 0 0 0    No Known Allergies  Prior to Admission medications   Medication Sig Start Date End Date Taking? Authorizing Provider  atorvastatin (LIPITOR) 20 MG tablet Take 1 tablet (20 mg total) by mouth at bedtime. 09/17/18  Yes Rutherford Guys, MD  budesonide (RHINOCORT AQUA) 32 MCG/ACT nasal spray Place 2 sprays into both nostrils daily. 10/12/17  Yes Wardell Honour, MD  Fexofenadine HCl (ALLEGRA PO) Take by mouth daily.   Yes [provider]  glucose blood test strip Check sugar once daily 09/17/18  Yes  Rutherford Guys, MD  glucose monitoring kit (FREESTYLE) monitoring kit 1 each by Does not apply route as needed for other. 02/18/17  Yes Wardell Honour, MD  ipratropium (ATROVENT) 0.03 % nasal spray Place 2 sprays into both nostrils 2 (two) times daily. 10/12/17  Yes Wardell Honour, MD  Lancets (FREESTYLE) lancets Check once daily 09/17/18  Yes Rutherford Guys, MD  levothyroxine (SYNTHROID, LEVOTHROID) 75 MCG tablet Take 1 tablet (75 mcg total) by mouth daily. 09/17/18  Yes Rutherford Guys, MD  lisinopril (PRINIVIL,ZESTRIL) 20 MG tablet Take 1/2 tablet daily 09/17/18  Yes Rutherford Guys, MD    Past Medical History:  Diagnosis Date  . Allergy    seasona  . Family history of early CAD 03/27/2016  . Hyperlipidemia   . Hypertension   . Prediabetes   . Thyroid disease     Past Surgical History:  Procedure Laterality Date  . [place]   x2    Social History   Tobacco Use  . Smoking status: Never Smoker  . Smokeless tobacco: Never Used  Substance Use Topics  . Alcohol use: No    Family History  Problem Relation Age of Onset  . Hypothyroidism Mother 5  . Heart disease Mother 24       chf  . Heart disease Father 68       AMI  . Heart attack Father   . Diabetes Sister   . Thyroid  disease Sister   . Hyperlipidemia Sister   . Hypertension Brother   . Hyperlipidemia Brother   . Colon cancer Neg Hx   . Rectal cancer Neg Hx   . Stomach cancer Neg Hx     Review of Systems  Constitutional: Negative for chills and fever.  HENT: Positive for sinus pain.   Respiratory: Negative for cough and shortness of breath.   Cardiovascular: Negative for chest pain, palpitations and leg swelling.  Gastrointestinal: Negative for abdominal pain, nausea and vomiting.  Genitourinary: Negative for dysuria and hematuria.       Neg breast lumps or nipple discharge Neg vaginal discharge, pelvic pain, dyspareunia, abnormal vaginal bleeding  All other systems reviewed and  are negative.    OBJECTIVE:  Blood pressure 124/82, pulse 72, temperature 98.9 F (37.2 C), temperature source Oral, height 5' 4.88" (1.648 m), weight 165 lb 8 oz (75.1 kg), SpO2 98 %. Body mass index is 27.64 kg/m.    Visual Acuity Screening   Right eye Left eye Both eyes  Without correction:     With correction: '20/20 20/20 20/20 '$   Wt Readings from Last 3 Encounters:  10/05/18 165 lb 8 oz (75.1 kg)  09/17/18 166 lb 12.8 oz (75.7 kg)  04/02/18 166 lb (75.3 kg)    Physical Exam Vitals signs and nursing note reviewed. Exam conducted with a chaperone present.  Constitutional:      Appearance: She is well-developed.  HENT:     Head: Normocephalic and atraumatic.     Right Ear: Hearing, tympanic membrane, ear canal and external ear normal.     Left Ear: Hearing, tympanic membrane, ear canal and external ear normal.  Eyes:     Conjunctiva/sclera: Conjunctivae normal.     Pupils: Pupils are equal, round, and reactive to light.  Neck:     Musculoskeletal: Neck supple.     Thyroid: No thyromegaly.  Cardiovascular:     Rate and Rhythm: Normal rate and regular rhythm.     Heart sounds: Normal heart sounds. No murmur. No friction rub. No gallop.   Pulmonary:     Effort: Pulmonary effort is normal.     Breath sounds: Normal breath sounds. No wheezing or rales.  Abdominal:     General: Bowel sounds are normal. There is no distension.     Palpations: Abdomen is soft. There is no mass.     Tenderness: There is no abdominal tenderness.  Genitourinary:    Labia:        Right: No rash or lesion.        Left: No rash or lesion.      Vagina: No erythema or lesions.     Cervix: Normal.     Uterus: Not enlarged and not fixed.      Adnexa:        Right: No mass or tenderness.         Left: No mass or tenderness.       Comments: Os stenotic, vaginal mucosa atrophic Musculoskeletal: Normal range of motion.  Lymphadenopathy:     Cervical: No cervical adenopathy.  Skin:    General:  Skin is warm and dry.  Neurological:     Mental Status: She is alert and oriented to person, place, and time.     Cranial Nerves: No cranial nerve deficit.     Gait: Gait normal.     Deep Tendon Reflexes: Reflexes are normal and symmetric.     ASSESSMENT and PLAN  1.  Women's annual routine gynecological examination No concerns per history or exam. HCM reviewed/discussed. Anticipatory guidance regarding healthy weight, lifestyle and choices given.   - Pap IG and HPV (high risk) DNA detection  2. Special screening for malignant neoplasms, colon - Cologuard  3. Family history of early CAD Discussed no further indication for stress testing, etc in setting of being asymptomatic. Discussed importance of working on risk factors.    Return in about 6 months (around 04/06/2019).    Rutherford Guys, MD [facility] [place], [place] [address] Ph.  [phone] Fax [phone]

## 2018-10-10 LAB — PAP IG AND HPV HIGH-RISK: HPV, high-risk: NEGATIVE

## 2018-11-01 LAB — COLOGUARD: Cologuard: NEGATIVE

## 2018-11-01 NOTE — Addendum Note (Signed)
Addended by: Baldwin Crown D on: 11/01/2018 09:50 AM   Modules accepted: Kipp Brood

## 2019-04-12 LAB — HM MAMMOGRAPHY

## 2019-10-28 ENCOUNTER — Other Ambulatory Visit: Payer: Self-pay | Admitting: Family Medicine

## 2019-10-28 NOTE — Telephone Encounter (Signed)
Forwarding medication refill to the clinical pool for review. 

## 2019-11-09 ENCOUNTER — Other Ambulatory Visit: Payer: Self-pay | Admitting: Family Medicine

## 2019-11-09 DIAGNOSIS — I10 Essential (primary) hypertension: Secondary | ICD-10-CM

## 2019-11-09 NOTE — Telephone Encounter (Signed)
Please schedule appt for any refills

## 2019-11-09 NOTE — Telephone Encounter (Signed)
Requested medication (s) are due for refill today: yes  Requested medication (s) are on the active medication list: yes  Last refill:  08/29/2019  Future visit scheduled: no  Notes to clinic:  overdue for follow up Review for refill   Requested Prescriptions  Pending Prescriptions Disp Refills   lisinopril (ZESTRIL) 20 MG tablet [Pharmacy Med Name: LISINOPRIL 20 MG TABLET] 45 tablet 3    Sig: TAKE 1/2 TABLET BY MOUTH EVERY DAY      Cardiovascular:  ACE Inhibitors Failed - 11/09/2019 12:19 PM      Failed - Cr in normal range and within 180 days    Creat  Date Value Ref Range Status  08/22/2016 0.61 0.50 - 1.05 mg/dL Final    Comment:      For patients > or = 58 years of age: The upper reference limit for Creatinine is approximately 13% higher for people identified as African-American.      Creatinine, Ser  Date Value Ref Range Status  09/17/2018 0.69 0.57 - 1.00 mg/dL Final          Failed - K in normal range and within 180 days    Potassium  Date Value Ref Range Status  09/17/2018 4.7 3.5 - 5.2 mmol/L Final          Failed - Valid encounter within last 6 months    Recent Outpatient Visits           1 year ago Women's annual routine gynecological examination   [facility], Meda Coffee, MD   1 year ago Hypothyroidism due to acquired atrophy of thyroid   [facility], Meda Coffee, MD   1 year ago Essential hypertension, benign   [facility], Meda Coffee, MD   2 years ago Routine physical examination   [facility], Myrle Sheng, MD   2 years ago HTN, goal below 140/90   [facility] at [facility], Myrle Sheng, MD              Passed - Patient is not pregnant      Passed - Last BP in normal range    BP Readings from Last 1 Encounters:  10/05/18 124/82

## 2019-11-11 NOTE — Telephone Encounter (Signed)
Lvm to call back and schedule med refill appt

## 2021-05-16 NOTE — Progress Notes (Signed)
Date:  05/20/2021   ID:  [name], DOB 12/26/1960, MRN [id number]  PCP:  Kathyrn Lass, MD  Cardiologist:  Rex Kras, DO, [facility] (established care 05/20/2021) Former Cardiology Providers: Golden Hurter, MD.  REASON FOR CONSULT: Palpitations  REQUESTING PHYSICIAN:  Kathyrn Lass, MD [place] [address]  Chief Complaint  Patient presents with   Palpitations    HPI  Alice Anderson is a 60 y.o. female who presents to the office with a chief complaint of " palpitations." Patient's past medical history and cardiovascular risk factors include: Hypertension, type 2 diabetes without insulin, hypothyroidism, hypercholesterolemia, postmenopausal female.  She is referred to the office at the request of Kathyrn Lass, MD for evaluation of palpitations.  Patient presents today for evaluation of palpitations.  A very pleasant [place] descent female who presents today for evaluation of palpitations.  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  Patient states that the symptoms did improve once on rectification started and therefore it may be situationally driven.  However, over the last several days/weeks she started noticing palpitations predominantly while she is trying to go to sleep at night.  They occur at rest, lasting for less than 60 seconds, no improving or worsening factors, self-limited.  No near-syncope or syncope.  She consumes 1 cup of coffee and tea per day. No consumption of sodas, herbal supplements, stimulants, weight loss supplements, or energy drinks. No known history of anemia. She does have history of hyperthyroidism and was found to be having iatrogenic hyperthyroid and therefore patient's PCP had modified her Synthroid medication.  And the TSH levels are improving.  Dad had his first MI at age of 53 and second MI at age of 58.  Patient has a 3 other siblings were overall healthy and alive without any  premature coronary artery disease or sudden cardiac death.  FUNCTIONAL STATUS: No structured exercise program or daily routine.  Patient is an Tourist information centre manager in and during the course of the day approximately walks 1 mile  ALLERGIES: No Known Allergies  MEDICATION LIST PRIOR TO VISIT: Current Meds  Medication Sig   atorvastatin (LIPITOR) 20 MG tablet Take 1 tablet (20 mg total) by mouth at bedtime.   budesonide (RHINOCORT AQUA) 32 MCG/ACT nasal spray Place 2 sprays into both nostrils daily.   glucose blood test strip Check sugar once daily   glucose monitoring kit (FREESTYLE) monitoring kit 1 each by Does not apply route as needed for other.   Lancets (FREESTYLE) lancets Check once daily   levothyroxine (SYNTHROID, LEVOTHROID) 75 MCG tablet Take 1 tablet (75 mcg total) by mouth daily.   lisinopril (PRINIVIL,ZESTRIL) 20 MG tablet Take 1/2 tablet daily   montelukast (SINGULAIR) 10 MG tablet Take 10 mg by mouth daily.   [DISCONTINUED] Fexofenadine HCl (ALLEGRA PO) Take by mouth daily.     PAST MEDICAL HISTORY: Past Medical History:  Diagnosis Date   Allergy    seasona   Family history of early CAD 03/27/2016   Hyperlipidemia    Hypertension    Prediabetes    Thyroid disease     PAST SURGICAL HISTORY: Past Surgical History:  Procedure Laterality Date   CESAREAN SECTION  1998 and 1999   x2    FAMILY HISTORY: The patient family history includes Diabetes in her sister; Heart attack in her father; Heart disease (age of onset: 24) in her father; Heart disease (age of onset: 48) in her mother; Hyperlipidemia in  her brother and sister; Hypertension in her brother; Hypothyroidism (age of onset: 17) in her mother; Thyroid disease in her sister.  SOCIAL HISTORY:  The patient  reports that she has never smoked. She has never used smokeless tobacco. She reports that she does not drink alcohol and does not use drugs.  REVIEW OF SYSTEMS: Review of Systems  Constitutional: Negative for chills  and fever.  HENT:  Negative for hoarse voice and nosebleeds.   Eyes:  Negative for discharge, double vision and pain.  Cardiovascular:  Positive for palpitations. Negative for chest pain, claudication, dyspnea on exertion, leg swelling, near-syncope, orthopnea, paroxysmal nocturnal dyspnea and syncope.  Respiratory:  Negative for hemoptysis and shortness of breath.   Musculoskeletal:  Negative for muscle cramps and myalgias.  Gastrointestinal:  Negative for abdominal pain, constipation, diarrhea, hematemesis, hematochezia, melena, nausea and vomiting.  Neurological:  Negative for dizziness and light-headedness.   PHYSICAL EXAM: Vitals with BMI 05/20/2021 10/05/2018 09/17/2018  Height '5\' 4"'  5' 4.88" 5' 4.88"  Weight 172 lbs 13 oz 165 lbs 8 oz 166 lbs 13 oz  BMI 29.65 28.36 62.94  Systolic 765 465 035  Diastolic 91 82 82  Pulse 99 72 79    CONSTITUTIONAL: Well-developed and well-nourished. No acute distress.  SKIN: Skin is warm and dry. No rash noted. No cyanosis. No pallor. No jaundice HEAD: Normocephalic and atraumatic.  EYES: No scleral icterus MOUTH/THROAT: Moist oral membranes.  NECK: No JVD present. No thyromegaly noted. No carotid bruits  LYMPHATIC: No visible cervical adenopathy.  CHEST Normal respiratory effort. No intercostal retractions  LUNGS: Clear to auscultation bilaterally.  No stridor. No wheezes. No rales.  CARDIOVASCULAR: Regular rate and rhythm, positive S1-S2, no murmurs rubs or gallops appreciated. ABDOMINAL: Soft, nontender, nondistended, positive bowel sounds in all 4 quadrants, no apparent ascites.  EXTREMITIES: No peripheral edema, 2+ dorsalis pedis and posterior tibial pulses. HEMATOLOGIC: No significant bruising NEUROLOGIC: Oriented to person, place, and time. Nonfocal. Normal muscle tone.  PSYCHIATRIC: Normal mood and affect. Normal behavior. Cooperative  CARDIAC DATABASE: EKG: 05/20/2021: Sinus  Rhythm, 85bpm, consider old anteroseptal infarct, without  injury pattern.  Echocardiogram: No results found for this or any previous visit from the past 1095 days.   Stress Testing: 04/2016 GXT: There was no ST segment deviation noted during stress. Normal heart rate and BP response to exercise. No evidence of ischemia at an adequate workload of 10.14mts. Normal GXT.  CAC Score:  04/18/2016 Total Score 0.   Heart Catheterization: None  LABORATORY DATA: CBC Latest Ref Rng & Units 09/17/2018 04/02/2018 10/12/2017  WBC 3.4 - 10.8 x10E3/uL 5.4 5.2 5.4  Hemoglobin 11.1 - 15.9 g/dL 12.8 13.4 13.4  Hematocrit 34.0 - 46.6 % 38.8 40.1 40.8  Platelets 150 - 450 x10E3/uL 327 316 350    CMP Latest Ref Rng & Units 09/17/2018 04/02/2018 10/12/2017  Glucose 65 - 99 mg/dL 94 100(H) 90  BUN 6 - 24 mg/dL '8 8 9  ' Creatinine 0.57 - 1.00 mg/dL 0.69 0.72 0.71  Sodium 134 - 144 mmol/L 138 140 140  Potassium 3.5 - 5.2 mmol/L 4.7 4.9 4.7  Chloride 96 - 106 mmol/L 99 101 101  CO2 20 - 29 mmol/L '22 25 22  ' Calcium 8.7 - 10.2 mg/dL 9.6 9.5 9.5  Total Protein 6.0 - 8.5 g/dL 7.3 7.0 7.3  Total Bilirubin 0.0 - 1.2 mg/dL 0.5 0.4 0.4  Alkaline Phos 39 - 117 IU/L 74 68 70  AST 0 - 40 IU/L 33 28 30  ALT 0 - 32 IU/L '30 30 31    ' Lipid Panel     Component Value Date/Time   CHOL 147 09/17/2018 1131   TRIG 95 09/17/2018 1131   HDL 47 09/17/2018 1131   CHOLHDL 3.1 09/17/2018 1131   CHOLHDL 3.5 08/22/2016 1038   VLDL 28 08/22/2016 1038   LDLCALC 81 09/17/2018 1131   LABVLDL 19 09/17/2018 1131    No components found for: NTPROBNP No results for input(s): PROBNP in the last 8760 hours. No results for input(s): TSH in the last 8760 hours.  BMP No results for input(s): NA, K, CL, CO2, GLUCOSE, BUN, CREATININE, CALCIUM, GFRNONAA, GFRAA in the last 8760 hours.  HEMOGLOBIN A1C Lab Results  Component Value Date   HGBA1C 6.1 (H) 09/17/2018   MPG 117 08/22/2016   External Labs: Collected: 03/22/2021 Hemoglobin A1c 6.6. AST 40 (above normal limits) ALT 45,  alkaline phosphatase 54 TSH 2.96 Total cholesterol 158, triglycerides 157, LDL 95, HDL 36, non-HDL 122 Creatinine 0.64 GFR >60 mL/min per 1.73 m Hemoglobin 12.5 g/dL, hematocrit 36.9% TSH: 0.31 (low).   IMPRESSION:    ICD-10-CM   1. Palpitations  R00.2 EKG 12-Lead    LONG TERM MONITOR (3-14 DAYS)    2. Benign hypertension  I10 PCV ECHOCARDIOGRAM COMPLETE    PCV CARDIAC STRESS TEST    3. Pure hypercholesterolemia  E78.00 PCV CARDIAC STRESS TEST    4. Type 2 diabetes mellitus without complication, without long-term current use of insulin (HCC)  E11.9 PCV CARDIAC STRESS TEST    5. Family history of heart disease  Z82.49 PCV CARDIAC STRESS TEST       RECOMMENDATIONS: Alice Anderson is a 60 y.o. female whose past medical history and cardiac risk factors include: Hypertension, type 2 diabetes without insulin, hypothyroidism, hypercholesterolemia, postmenopausal female.  With regards to palpitations the symptoms in general have improved after reducing caffeine intake and titrating her thyroid medication as per PCP recommendations.  However she still continues to have episodes predominantly at night lasting for less than 60 seconds but frequent.  The shared decision was to proceed with an Holter monitor for 14 days to evaluate for underlying dysrhythmias.  I have also asked her to follow-up with her PCP to have her TSH checked to make sure that she is on appropriate therapy.  Otherwise no identifiable reversible causes.  Patient is also noted to have diabetes with a hemoglobin A1c of 6.6.  Currently not on medications as she is trying to control it via her diet.  She also has family history of CAD and other cardiovascular risk factors such as hypertension and hyperlipidemia.  The shared decision was to proceed with an echocardiogram to evaluate for structural heart disease and exercise treadmill stress test for functional status and exercise-induced ischemia.  Hypertension hyperlipidemia are  currently managed by PCP.  Blood pressure within acceptable range.  Recommend an LDL of less than 70 mg/dL in the setting of diabetes mellitus.  As part of this consultation reviewed outside records provided by PCP which included office notes, EKG, labs.  Also reviewed prior diagnostic testing results which included: Total coronary calcium score and exercise treadmill stress test from 2017.  Patient is thankful for the plan of care and is agreeable with the recommendations mentioned above.  FINAL MEDICATION LIST END OF ENCOUNTER: No orders of the defined types were placed in this encounter.   Medications Discontinued During This Encounter  Medication Reason   ipratropium (ATROVENT) 0.03 % nasal spray Error  Fexofenadine HCl (ALLEGRA PO) Patient Preference     Current Outpatient Medications:    atorvastatin (LIPITOR) 20 MG tablet, Take 1 tablet (20 mg total) by mouth at bedtime., Disp: 90 tablet, Rfl: 3   budesonide (RHINOCORT AQUA) 32 MCG/ACT nasal spray, Place 2 sprays into both nostrils daily., Disp: 8.6 g, Rfl: 11   glucose blood test strip, Check sugar once daily, Disp: 100 each, Rfl: 3   glucose monitoring kit (FREESTYLE) monitoring kit, 1 each by Does not apply route as needed for other., Disp: 1 each, Rfl: 0   Lancets (FREESTYLE) lancets, Check once daily, Disp: 100 each, Rfl: 5   levothyroxine (SYNTHROID, LEVOTHROID) 75 MCG tablet, Take 1 tablet (75 mcg total) by mouth daily., Disp: 90 tablet, Rfl: 3   lisinopril (PRINIVIL,ZESTRIL) 20 MG tablet, Take 1/2 tablet daily, Disp: 45 tablet, Rfl: 3   montelukast (SINGULAIR) 10 MG tablet, Take 10 mg by mouth daily., Disp: , Rfl:   Orders Placed This Encounter  Procedures   LONG TERM MONITOR (3-14 DAYS)   PCV CARDIAC STRESS TEST   EKG 12-Lead   PCV ECHOCARDIOGRAM COMPLETE    There are no Patient Instructions on file for this visit.   --Continue cardiac medications as reconciled in final medication list. --Return in about 4 weeks  (around 06/17/2021) for Follow up, Palpitations, Review test results. Or sooner if needed. --Continue follow-up with your primary care physician regarding the management of your other chronic comorbid conditions.  Patient's questions and concerns were addressed to her satisfaction. She voices understanding of the instructions provided during this encounter.   This note was created using a voice recognition software as a result there may be grammatical errors inadvertently enclosed that do not reflect the nature of this encounter. Every attempt is made to correct such errors.  Rex Kras, [place], [facility]  Pager: [phone] Office: [phone]

## 2021-05-20 ENCOUNTER — Ambulatory Visit: Payer: BC Managed Care – PPO | Admitting: Cardiology

## 2021-05-20 ENCOUNTER — Encounter: Payer: Self-pay | Admitting: Cardiology

## 2021-05-20 ENCOUNTER — Other Ambulatory Visit: Payer: Self-pay

## 2021-05-20 VITALS — BP 133/91 | HR 99 | Temp 97.7°F | Resp 16 | Ht 64.0 in | Wt 172.8 lb

## 2021-05-20 DIAGNOSIS — R002 Palpitations: Secondary | ICD-10-CM

## 2021-05-20 DIAGNOSIS — E7800 Pure hypercholesterolemia, unspecified: Secondary | ICD-10-CM

## 2021-05-20 DIAGNOSIS — Z8249 Family history of ischemic heart disease and other diseases of the circulatory system: Secondary | ICD-10-CM

## 2021-05-20 DIAGNOSIS — E119 Type 2 diabetes mellitus without complications: Secondary | ICD-10-CM

## 2021-05-20 DIAGNOSIS — I10 Essential (primary) hypertension: Secondary | ICD-10-CM

## 2021-05-22 ENCOUNTER — Ambulatory Visit: Payer: BC Managed Care – PPO

## 2021-05-22 ENCOUNTER — Other Ambulatory Visit: Payer: Self-pay

## 2021-05-22 ENCOUNTER — Inpatient Hospital Stay: Payer: BC Managed Care – PPO

## 2021-05-22 DIAGNOSIS — I10 Essential (primary) hypertension: Secondary | ICD-10-CM

## 2021-05-22 DIAGNOSIS — R002 Palpitations: Secondary | ICD-10-CM

## 2021-07-12 ENCOUNTER — Other Ambulatory Visit: Payer: Self-pay

## 2021-07-12 ENCOUNTER — Ambulatory Visit: Payer: BC Managed Care – PPO

## 2021-07-12 DIAGNOSIS — I10 Essential (primary) hypertension: Secondary | ICD-10-CM

## 2021-07-12 DIAGNOSIS — E7800 Pure hypercholesterolemia, unspecified: Secondary | ICD-10-CM

## 2021-07-12 DIAGNOSIS — Z8249 Family history of ischemic heart disease and other diseases of the circulatory system: Secondary | ICD-10-CM

## 2021-07-12 DIAGNOSIS — E119 Type 2 diabetes mellitus without complications: Secondary | ICD-10-CM

## 2021-07-15 ENCOUNTER — Telehealth: Payer: Self-pay | Admitting: Cardiology

## 2021-07-15 NOTE — Telephone Encounter (Signed)
Patient's note has been done tried calling patient no answer left a vm

## 2021-07-15 NOTE — Telephone Encounter (Signed)
Patient requesting a dr note. She was here for a stress test on Friday and forgot to ask for a note to be excused while she was here

## 2021-07-15 NOTE — Telephone Encounter (Signed)
Patient requesting a dr note. She was here for a stress test on Friday and forgot to ask for a note to be excused while she was here

## 2021-07-15 NOTE — Telephone Encounter (Signed)
Please take care of Alice Anderson. Thank you.

## 2021-07-17 ENCOUNTER — Encounter: Payer: Self-pay | Admitting: Cardiology

## 2021-07-17 ENCOUNTER — Ambulatory Visit: Payer: BC Managed Care – PPO | Admitting: Cardiology

## 2021-07-17 ENCOUNTER — Other Ambulatory Visit: Payer: Self-pay

## 2021-07-17 VITALS — BP 122/82 | HR 70 | Temp 97.7°F | Resp 16 | Ht 64.0 in | Wt 170.0 lb

## 2021-07-17 DIAGNOSIS — Z8249 Family history of ischemic heart disease and other diseases of the circulatory system: Secondary | ICD-10-CM

## 2021-07-17 DIAGNOSIS — E119 Type 2 diabetes mellitus without complications: Secondary | ICD-10-CM

## 2021-07-17 DIAGNOSIS — E7800 Pure hypercholesterolemia, unspecified: Secondary | ICD-10-CM

## 2021-07-17 DIAGNOSIS — I10 Essential (primary) hypertension: Secondary | ICD-10-CM

## 2021-07-17 DIAGNOSIS — R002 Palpitations: Secondary | ICD-10-CM

## 2021-07-17 NOTE — Progress Notes (Signed)
 Date:  07/17/2021   ID:  Alice Anderson, DOB 11/07/1960, MRN [id number]  PCP:  Miller, Lisa, MD  Cardiologist:  Sunit Tolia, DO, FACC (established care 05/20/2021) Former Cardiology Providers: Tracy Turner, MD.  Date: 07/17/21 Last Office Visit: 05/20/2021  Chief Complaint  Patient presents with   Palpitations   Results   Follow-up    HPI  Alice Anderson is a 60 y.o. female who presents to the office with a chief complaint of " reevaluation of palpitations and discuss test results." Patient's past medical history and cardiovascular risk factors include: Hypertension, type 2 diabetes without insulin, hypothyroidism, hypercholesterolemia, postmenopausal female.  She is referred to the office at the request of Miller, Lisa, MD for evaluation of palpitations.  Patient initially presented in August 2022 for evaluation of palpitations.  Her symptoms improved with reducing caffeine intake but did not resolve.  Since last office visit patient states that she still has intermittent episodes of palpitations but overall intensity, frequency, duration is very well controlled.  She underwent a 14-day extended Holter monitor which noted the underlying rhythm to be predominantly normal sinus followed by sinus tachycardia.  With an average heart rate of 81 bpm no significant dysrhythmias.  Given her other cardiovascular risk factors such as diabetes, family history of CAD patient did also undergo an echocardiogram and exercise treadmill stress test.  Echocardiogram notes preserved LVEF, no regional wall motion abnormalities, normal diastolic function, and no valvular heart disease.  And stress test was overall a low risk study.  Dad had his first MI at age of 59 and second MI at age of 64.  Patient has a 3 other siblings were overall healthy and alive without any premature coronary artery disease or sudden cardiac death.  FUNCTIONAL STATUS: No structured exercise program or daily routine.  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   budesonide (RHINOCORT AQUA) 32 MCG/ACT nasal spray Place 2 sprays into both nostrils daily.   glucose blood test strip Check sugar once daily   glucose monitoring kit (FREESTYLE) monitoring kit 1 each by Does not apply route as needed for other.   Lancets (FREESTYLE) lancets Check once daily   levothyroxine (SYNTHROID, LEVOTHROID) 75 MCG tablet Take 1 tablet (75 mcg total) by mouth daily.   lisinopril (PRINIVIL,ZESTRIL) 20 MG tablet Take 1/2 tablet daily   montelukast (SINGULAIR) 10 MG tablet Take 10 mg by mouth daily.     PAST MEDICAL HISTORY: Past Medical History:  Diagnosis Date   Allergy    seasona   Family history of early CAD 03/27/2016   Hyperlipidemia    Hypertension    Prediabetes    Thyroid disease     PAST SURGICAL HISTORY: Past Surgical History:  Procedure Laterality Date   CESAREAN SECTION  1998 and 1999   x2    FAMILY HISTORY: The patient family history includes Diabetes in her sister; Heart attack in her father; Heart disease (age of onset: 55) in her father; Heart disease (age of onset: 82) in her mother; Hyperlipidemia in her brother and sister; Hypertension in her brother; Hypothyroidism (age of onset: 50) in her mother; Thyroid disease in her sister.  SOCIAL HISTORY:  The patient  reports that she has never smoked. She has never used   smokeless tobacco. She reports that she does not drink alcohol and does not use drugs.  REVIEW OF SYSTEMS: Review of Systems  Constitutional: Negative for chills and fever.  HENT:  Negative for hoarse voice and nosebleeds.   Eyes:  Negative for discharge, double vision and pain.  Cardiovascular:  Positive for palpitations. Negative for chest pain, claudication, dyspnea on  exertion, leg swelling, near-syncope, orthopnea, paroxysmal nocturnal dyspnea and syncope.  Respiratory:  Negative for hemoptysis and shortness of breath.   Musculoskeletal:  Negative for muscle cramps and myalgias.  Gastrointestinal:  Negative for abdominal pain, constipation, diarrhea, hematemesis, hematochezia, melena, nausea and vomiting.  Neurological:  Negative for dizziness and light-headedness.   PHYSICAL EXAM: Vitals with BMI 07/17/2021 05/20/2021 10/05/2018  Height 5' 4" 5' 4" 5' 4.88"  Weight 170 lbs 172 lbs 13 oz 165 lbs 8 oz  BMI 29.17 29.65 27.64  Systolic 122 133 124  Diastolic 82 91 82  Pulse 70 99 72    CONSTITUTIONAL: Well-developed and well-nourished. No acute distress.  SKIN: Skin is warm and dry. No rash noted. No cyanosis. No pallor. No jaundice HEAD: Normocephalic and atraumatic.  EYES: No scleral icterus MOUTH/THROAT: Moist oral membranes.  NECK: No JVD present. No thyromegaly noted. No carotid bruits  LYMPHATIC: No visible cervical adenopathy.  CHEST Normal respiratory effort. No intercostal retractions  LUNGS: Clear to auscultation bilaterally.  No stridor. No wheezes. No rales.  CARDIOVASCULAR: Regular rate and rhythm, positive S1-S2, no murmurs rubs or gallops appreciated. ABDOMINAL: Soft, nontender, nondistended, positive bowel sounds in all 4 quadrants, no apparent ascites.  EXTREMITIES: No peripheral edema, 2+ dorsalis pedis and posterior tibial pulses. HEMATOLOGIC: No significant bruising NEUROLOGIC: Oriented to person, place, and time. Nonfocal. Normal muscle tone.  PSYCHIATRIC: Normal mood and affect. Normal behavior. Cooperative  CARDIAC DATABASE: EKG: 05/20/2021: Sinus  Rhythm, 85bpm, consider old anteroseptal infarct, without injury pattern.  Echocardiogram: 05/22/2021: Normal LV systolic function with visual EF 60-65%. Left ventricle cavity is normal in size. Normal left ventricular wall thickness. Normal global wall motion. Normal diastolic  filling pattern, normal LAP.  No significant valvular heart disease. No prior study for comparison.   Stress Testing: Exercise treadmill stress test 07/12/2021: Functional status: Average. Chest pain: No. Reason for stopping exercise: Fatigue and weakness. Hypertensive response to exercise: No. Exercise time 9 minutes 00 seconds on Bruce protocol, achieved 10.16 METS, 104% of APMHR. Stress ECG negative for ischemia.   Low risk study.    CAC Score:  04/18/2016 Total Score 0.   Heart Catheterization: None  14 day extended Holter monitor: Dominant rhythm normal sinus, followed by sinus tachycardia (13% burden). Heart rate 53-141 bpm. Avg HR 81 bpm. No atrial fibrillation, supraventricular tachycardia, ventricular tachycardia, high grade AV block, pauses (3 seconds or longer). Total ventricular ectopic burden <1% (isolated PVCs, PVCs and either bigeminy or trigeminy pattern).  Total supraventricular ectopic burden <1%. Patient triggered events: 9. Underlying rhythm normal sinus with episodes of ventricular ectopy.  LABORATORY DATA: CBC Latest Ref Rng & Units 09/17/2018 04/02/2018 10/12/2017  WBC 3.4 - 10.8 x10E3/uL 5.4 5.2 5.4  Hemoglobin 11.1 - 15.9 g/dL 12.8 13.4 13.4  Hematocrit 34.0 - 46.6 % 38.8 40.1 40.8  Platelets 150 - 450 x10E3/uL 327 316 350    CMP Latest Ref Rng & Units 09/17/2018 04/02/2018 10/12/2017  Glucose 65 - 99 mg/dL 94 100(H) 90  BUN 6 - 24 mg/dL 8 8 9  Creatinine 0.57 - 1.00 mg/dL 0.69 0.72 0.71  Sodium 134 - 144   mmol/L 138 140 140  Potassium 3.5 - 5.2 mmol/L 4.7 4.9 4.7  Chloride 96 - 106 mmol/L 99 101 101  CO2 20 - 29 mmol/L 22 25 22  Calcium 8.7 - 10.2 mg/dL 9.6 9.5 9.5  Total Protein 6.0 - 8.5 g/dL 7.3 7.0 7.3  Total Bilirubin 0.0 - 1.2 mg/dL 0.5 0.4 0.4  Alkaline Phos 39 - 117 IU/L 74 68 70  AST 0 - 40 IU/L 33 28 30  ALT 0 - 32 IU/L 30 30 31    Lipid Panel     Component Value Date/Time   CHOL 147 09/17/2018 1131   TRIG 95 09/17/2018 1131   HDL  47 09/17/2018 1131   CHOLHDL 3.1 09/17/2018 1131   CHOLHDL 3.5 08/22/2016 1038   VLDL 28 08/22/2016 1038   LDLCALC 81 09/17/2018 1131   LABVLDL 19 09/17/2018 1131    No components found for: NTPROBNP No results for input(s): PROBNP in the last 8760 hours. No results for input(s): TSH in the last 8760 hours.  BMP No results for input(s): NA, K, CL, CO2, GLUCOSE, BUN, CREATININE, CALCIUM, GFRNONAA, GFRAA in the last 8760 hours.  HEMOGLOBIN A1C Lab Results  Component Value Date   HGBA1C 6.1 (H) 09/17/2018   MPG 117 08/22/2016   External Labs: Collected: 03/22/2021 Hemoglobin A1c 6.6. AST 40 (above normal limits) ALT 45, alkaline phosphatase 54 TSH 2.96 Total cholesterol 158, triglycerides 157, LDL 95, HDL 36, non-HDL 122 Creatinine 0.64 GFR >60 mL/min per 1.73 m Hemoglobin 12.5 g/dL, hematocrit 36.9% TSH: 0.31 (low).   IMPRESSION:    ICD-10-CM   1. Palpitations  R00.2     2. Benign hypertension  I10     3. Pure hypercholesterolemia  E78.00     4. Type 2 diabetes mellitus without complication, without long-term current use of insulin (HCC)  E11.9     5. Family history of heart disease  Z82.49         RECOMMENDATIONS: Alice Anderson is a 60 y.o. female whose past medical history and cardiac risk factors include: Hypertension, type 2 diabetes without insulin, hypothyroidism, hypercholesterolemia, postmenopausal female.  Palpitations Symptoms have improved after reducing caffeine intake. Her thyroid medications are also being titrated by her other providers. Results of the 14-day extended Holter monitor reviewed with the patient during today's office visit. Recommended beta-blocker therapy if her symptoms are affecting her day-to-day activities; however, patient would like to hold off on pharmacological therapy at this time.  She will reach out to the office if the symptoms increase in intensity, frequency and/or duration.   Benign hypertension Office blood pressures  are very well controlled. Medications reconciled. She is consuming a low-salt diet.  Pure hypercholesterolemia Currently on atorvastatin.   Would recommend a goal LDL less than 70 mg/dL in the setting of diabetes. She denies myalgia or other side effects. Most recent lipids dated June 2022 reviewed as noted above. Currently managed by primary care provider.  Type 2 diabetes mellitus without complication, without long-term current use of insulin (HCC) Most recent hemoglobin A1c reviewed. Currently on ACE inhibitor's and statin therapy. Monitor for now.  Results of the echo and stress test reviewed with the patient at today's office visit and noted above for further reference.  I will see the patient back on an annual basis or sooner if change in clinical status.  FINAL MEDICATION LIST END OF ENCOUNTER: No orders of the defined types were placed in this encounter.   There are no discontinued medications.      Current Outpatient Medications:    atorvastatin (LIPITOR) 20 MG tablet, Take 1 tablet (20 mg total) by mouth at bedtime., Disp: 90 tablet, Rfl: 3   budesonide (RHINOCORT AQUA) 32 MCG/ACT nasal spray, Place 2 sprays into both nostrils daily., Disp: 8.6 g, Rfl: 11   glucose blood test strip, Check sugar once daily, Disp: 100 each, Rfl: 3   glucose monitoring kit (FREESTYLE) monitoring kit, 1 each by Does not apply route as needed for other., Disp: 1 each, Rfl: 0   Lancets (FREESTYLE) lancets, Check once daily, Disp: 100 each, Rfl: 5   levothyroxine (SYNTHROID, LEVOTHROID) 75 MCG tablet, Take 1 tablet (75 mcg total) by mouth daily., Disp: 90 tablet, Rfl: 3   lisinopril (PRINIVIL,ZESTRIL) 20 MG tablet, Take 1/2 tablet daily, Disp: 45 tablet, Rfl: 3   montelukast (SINGULAIR) 10 MG tablet, Take 10 mg by mouth daily., Disp: , Rfl:   No orders of the defined types were placed in this encounter.   There are no Patient Instructions on file for this visit.   --Continue cardiac  medications as reconciled in final medication list. --Return in about 1 year (around 07/17/2022), or if symptoms worsen or fail to improve, for Follow up, Palpitations. Or sooner if needed. --Continue follow-up with your primary care physician regarding the management of your other chronic comorbid conditions.  Patient's questions and concerns were addressed to her satisfaction. She voices understanding of the instructions provided during this encounter.   This note was created using a voice recognition software as a result there may be grammatical errors inadvertently enclosed that do not reflect the nature of this encounter. Every attempt is made to correct such errors.  Rex Kras, [place], [facility]  Pager: [phone] Office: [phone]

## 2021-07-22 NOTE — Telephone Encounter (Signed)
From pt

## 2021-07-30 NOTE — Telephone Encounter (Signed)
Just wanted to make sure your LDL is being followed given you DM as it should be less than 70mg /dL.   Checking it once a year is fine w/ your yearly physical as long as its stable and well.

## 2021-10-11 ENCOUNTER — Other Ambulatory Visit: Payer: Self-pay | Admitting: Family Medicine

## 2021-10-11 DIAGNOSIS — R634 Abnormal weight loss: Secondary | ICD-10-CM

## 2021-11-08 ENCOUNTER — Other Ambulatory Visit: Payer: BC Managed Care – PPO

## 2021-12-10 ENCOUNTER — Other Ambulatory Visit: Payer: Self-pay | Admitting: Family Medicine

## 2021-12-10 DIAGNOSIS — R634 Abnormal weight loss: Secondary | ICD-10-CM

## 2021-12-13 ENCOUNTER — Encounter: Payer: Self-pay | Admitting: Family Medicine

## 2021-12-13 ENCOUNTER — Ambulatory Visit
Admission: RE | Admit: 2021-12-13 | Discharge: 2021-12-13 | Disposition: A | Discharge status: Home / Self Care | Payer: BC Managed Care – PPO | Source: Ambulatory Visit | Attending: Family Medicine | Admitting: Family Medicine

## 2021-12-13 DIAGNOSIS — R634 Abnormal weight loss: Secondary | ICD-10-CM

## 2021-12-25 ENCOUNTER — Other Ambulatory Visit: Payer: Self-pay

## 2021-12-25 ENCOUNTER — Ambulatory Visit
Admission: RE | Admit: 2021-12-25 | Discharge: 2021-12-25 | Disposition: A | Discharge status: Home / Self Care | Payer: BC Managed Care – PPO | Source: Ambulatory Visit | Attending: Family Medicine | Admitting: Family Medicine

## 2021-12-25 DIAGNOSIS — R634 Abnormal weight loss: Secondary | ICD-10-CM

## 2022-01-30 LAB — EXTERNAL GENERIC LAB PROCEDURE: COLOGUARD: NEGATIVE

## 2022-01-30 LAB — COLOGUARD: COLOGUARD: NEGATIVE

## 2022-05-19 ENCOUNTER — Ambulatory Visit: Payer: BC Managed Care – PPO | Admitting: Cardiology

## 2022-05-20 ENCOUNTER — Encounter: Payer: Self-pay | Admitting: Cardiology

## 2022-05-20 ENCOUNTER — Ambulatory Visit: Payer: BC Managed Care – PPO | Admitting: Cardiology

## 2022-05-20 VITALS — BP 130/78 | HR 87 | Temp 97.6°F | Resp 16 | Ht 64.0 in | Wt 166.0 lb

## 2022-05-20 DIAGNOSIS — Z8249 Family history of ischemic heart disease and other diseases of the circulatory system: Secondary | ICD-10-CM

## 2022-05-20 DIAGNOSIS — E119 Type 2 diabetes mellitus without complications: Secondary | ICD-10-CM

## 2022-05-20 DIAGNOSIS — E7800 Pure hypercholesterolemia, unspecified: Secondary | ICD-10-CM

## 2022-05-20 DIAGNOSIS — I10 Essential (primary) hypertension: Secondary | ICD-10-CM

## 2022-05-20 DIAGNOSIS — R002 Palpitations: Secondary | ICD-10-CM

## 2022-05-20 NOTE — Progress Notes (Signed)
Date:  05/20/2022   ID:  Alice Anderson, DOB 07/21/1961, MRN [id number]  PCP:  Kathyrn Lass, MD  Cardiologist:  Rex Kras, DO, [facility] (established care 05/20/2021) Former Cardiology Providers: Golden Hurter, MD.  Date: 05/20/22 Last Office Visit: 07/17/2021  Chief Complaint  Patient presents with   Palpitations   Follow-up    1 year    HPI  Alice Anderson is a 61 y.o. female whose past medical history and cardiovascular risk factors include: Hypertension, type 2 diabetes without insulin, hypothyroidism, hypercholesterolemia, postmenopausal female.  Patient presents for 1 year follow-up.  In the past patient was referred to the practice for evaluation of palpitation which improved significantly after the reduction of caffeinated beverages on a daily basis.  Given her other comorbid conditions including diabetes, family history of CAD, hyperlipidemia she did undergo echo and stress test in the recent past.  Results reviewed with her today's office visit.  Since last office visit her palpitations are essentially resolved.  But she does have infrequent episodes when she is at work.  She denies anginal discomfort or heart failure symptoms.  Dad had his first MI at age of 26 and second MI at age of 50.  Patient has a 3 other siblings were overall healthy and alive without any premature coronary artery disease or sudden cardiac death.  FUNCTIONAL STATUS: No structured exercise program or daily routine.  Patient is an Tourist information centre manager in and during the course of the day approximately walks 1 mile  ALLERGIES: Allergies  Allergen Reactions   Glimepiride     Other reaction(s): hypoglycemia on 93m and 453m  Metformin     Other reaction(s): Dizziness    MEDICATION LIST PRIOR TO VISIT: Current Meds  Medication Sig   atorvastatin (LIPITOR) 20 MG tablet Take 1 tablet (20 mg total) by mouth at bedtime.   cetirizine (ZYRTEC ALLERGY) 10 MG tablet Take 1 tablet by mouth daily.   FLOVENT HFA 110 MCG/ACT  inhaler Inhale 1 puff into the lungs 2 (two) times daily.   glucose blood test strip Check sugar once daily   glucose monitoring kit (FREESTYLE) monitoring kit 1 each by Does not apply route as needed for other.   Lancets (FREESTYLE) lancets Check once daily   levothyroxine (SYNTHROID, LEVOTHROID) 75 MCG tablet Take 1 tablet (75 mcg total) by mouth daily.   lisinopril (ZESTRIL) 10 MG tablet Take 1 tablet by mouth daily.   montelukast (SINGULAIR) 10 MG tablet Take 10 mg by mouth daily.   [DISCONTINUED] montelukast (SINGULAIR) 10 MG tablet Take 1 tablet by mouth daily.     PAST MEDICAL HISTORY: Past Medical History:  Diagnosis Date   Allergy    seasona   Family history of early CAD 03/27/2016   Hyperlipidemia    Hypertension    Prediabetes    Thyroid disease     PAST SURGICAL HISTORY: Past Surgical History:  Procedure Laterality Date   CESAREAN SECTION  1998 and 1999   x2    FAMILY HISTORY: The patient family history includes Diabetes in her sister; Heart attack in her father; Heart disease (age of onset: 5529in her father; Heart disease (age of onset: 829in her mother; Hyperlipidemia in her brother and sister; Hypertension in her brother; Hypothyroidism (age of onset: 5068in her mother; Thyroid disease in her sister.  SOCIAL HISTORY:  The patient  reports that she has never smoked. She has never used smokeless tobacco. She reports that she does not drink alcohol and does not  use drugs.  REVIEW OF SYSTEMS: Review of Systems  Constitutional: Negative for chills and fever.  HENT:  Negative for hoarse voice and nosebleeds.   Eyes:  Negative for discharge, double vision and pain.  Cardiovascular:  Negative for chest pain, claudication, dyspnea on exertion, leg swelling, near-syncope, orthopnea, palpitations, paroxysmal nocturnal dyspnea and syncope.  Respiratory:  Negative for hemoptysis and shortness of breath.   Musculoskeletal:  Negative for muscle cramps and myalgias.   Gastrointestinal:  Negative for abdominal pain, constipation, diarrhea, hematemesis, hematochezia, melena, nausea and vomiting.  Neurological:  Negative for dizziness and light-headedness.    PHYSICAL EXAM:    05/20/2022    1:29 PM 07/17/2021   10:41 AM 05/20/2021    8:42 AM  Vitals with BMI  Height _0  _1  _2   Weight 166 lbs 170 lbs 172 lbs 13 oz  BMI 28.48 63.01 60.10  Systolic 932 355 732  Diastolic 78 82 91  Pulse 87 70 99    Physical Exam  Constitutional: No distress.  Age appropriate, hemodynamically stable.   Neck: No JVD present.  Cardiovascular: Normal rate, regular rhythm, S1 normal, S2 normal, intact distal pulses and normal pulses. Exam reveals no gallop, no S3 and no S4.  No murmur heard. Varicose veins noted bilaterally.  Pulmonary/Chest: Effort normal and breath sounds normal. No stridor. She has no wheezes. She has no rales.  Abdominal: Soft. Bowel sounds are normal. She exhibits no distension. There is no abdominal tenderness.  Musculoskeletal:        General: No edema.     Cervical back: Neck supple.  Neurological: She is alert and oriented to person, place, and time. She has intact cranial nerves (2-12).  Skin: Skin is warm and moist.   CARDIAC DATABASE: EKG: 05/20/2022: NSR, 77 bpm, without underlying ischemia injury pattern.    Echocardiogram: 05/22/2021: Normal LV systolic function with visual EF 60-65%. Left ventricle cavity is normal in size. Normal left ventricular wall thickness. Normal global wall motion. Normal diastolic filling pattern, normal LAP.  No significant valvular heart disease. No prior study for comparison.   Stress Testing: Exercise treadmill stress test 07/12/2021: Functional status: Average. Chest pain: No. Reason for stopping exercise: Fatigue and weakness. Hypertensive response to exercise: No. Exercise time 9 minutes 00 seconds on Bruce protocol, achieved 10.16 METS, 104% of APMHR. Stress ECG negative for ischemia.    Low risk study.   CAC Score:  04/18/2016 Total Score 0.   Heart Catheterization: None  14 day extended Holter monitor: Dominant rhythm normal sinus, followed by sinus tachycardia (13% burden). Heart rate 53-141 bpm. Avg HR 81 bpm. No atrial fibrillation, supraventricular tachycardia, ventricular tachycardia, high grade AV block, pauses (3 seconds or longer). Total ventricular ectopic burden <1% (isolated PVCs, PVCs and either bigeminy or trigeminy pattern).  Total supraventricular ectopic burden <1%. Patient triggered events: 9. Underlying rhythm normal sinus with episodes of ventricular ectopy.  LABORATORY DATA:    Latest Ref Rng & Units 09/17/2018   11:31 AM 04/02/2018   11:43 AM 10/12/2017   11:36 AM  CBC  WBC 3.4 - 10.8 x10E3/uL 5.4  5.2  5.4   Hemoglobin 11.1 - 15.9 g/dL 12.8  13.4  13.4   Hematocrit 34.0 - 46.6 % 38.8  40.1  40.8   Platelets 150 - 450 x10E3/uL 327  316  350        Latest Ref Rng & Units 09/17/2018   11:31 AM 04/02/2018   11:43 AM 10/12/2017  11:36 AM  CMP  Glucose 65 - 99 mg/dL 94  100  90   BUN 6 - 24 mg/dL _0 Creatinine 0.57 - 1.00 mg/dL 0.69  0.72  0.71   Sodium 134 - 144 mmol/L 138  140  140   Potassium 3.5 - 5.2 mmol/L 4.7  4.9  4.7   Chloride 96 - 106 mmol/L 99  101  101   CO2 20 - 29 mmol/L _1 Calcium 8.7 - 10.2 mg/dL 9.6  9.5  9.5   Total Protein 6.0 - 8.5 g/dL 7.3  7.0  7.3   Total Bilirubin 0.0 - 1.2 mg/dL 0.5  0.4  0.4   Alkaline Phos 39 - 117 IU/L 74  68  70   AST 0 - 40 IU/L 33  28  30   ALT 0 - 32 IU/L _2 Lipid Panel  Lab Results  Component Value Date   CHOL 147 09/17/2018   HDL 47 09/17/2018   LDLCALC 81 09/17/2018   TRIG 95 09/17/2018   CHOLHDL 3.1 09/17/2018    No components found for: "NTPROBNP" No results for input(s): "PROBNP" in the last 8760 hours. No results for input(s): "TSH" in the last 8760 hours.  BMP No results for input(s): "NA", "K", "CL", "CO2", "GLUCOSE", "BUN",  "CREATININE", "CALCIUM", "GFRNONAA", "GFRAA" in the last 8760 hours.  HEMOGLOBIN A1C Lab Results  Component Value Date   HGBA1C 6.1 (H) 09/17/2018   MPG 117 08/22/2016   External Labs: Collected: 03/22/2021 Hemoglobin A1c 6.6. AST 40 (above normal limits) ALT 45, alkaline phosphatase 54 TSH 2.96 Total cholesterol 158, triglycerides 157, LDL 95, HDL 36, non-HDL 122 Creatinine 0.64 GFR >60 mL/min per 1.73 m Hemoglobin 12.5 g/dL, hematocrit 36.9% TSH: 0.31 (low).   Hemoglobin A1c   2022-03-31    eAG 140      Hgb A1c 6.5   6.5-4.6  Comp Metabolic Panel   5035-46-56    Albumin 4.4   3.4-4.8  ALP 59   38-126  ALT 26   0-52  Anion Gap 10.0   6.0-20.0  AST 27   0-39  BUN 10   6-26  CO2 30   22-32  CA-corrected 9.21   8.60-10.30  Calcium 9.6   8.6-10.3  Chloride 103   98-107  Creatinine 0.64   0.60-1.30  eGFR2021 101   >60  Glucose 101   70-99  Potassium 4.8   3.5-5.5  Sodium 138   136-145  Protein, Total 7.0   6.0-8.3  TBIL 0.6   0.3-1.0  Microalbumin Panel   2022-03-31    MA/CR ratio 4.8   0.0-30.0  UCR 178      UMA 0.85   0.00-1.90  Lipid Panel w/reflex   2022-03-31    LDL Chol Calc (NIH) 66   0-99  CHOL/HDL 3.0   2.0-4.0  Cholesterol 124   <200  HDLD 41   30-85  LDL Chol Calc (NIH) 66   0-99  NHDL 83   0-129  Triglyceride 92   0-199    IMPRESSION:    ICD-10-CM   1. Palpitations  R00.2 EKG 12-Lead    2. Benign hypertension  I10     3. Pure hypercholesterolemia  E78.00 CT CARDIAC SCORING ([facility] LOCATIONS ONLY)    4. Type 2 diabetes mellitus without complication, without long-term current use of insulin (HCC)  E11.9 CT CARDIAC SCORING (  [facility] LOCATIONS ONLY)    5. Family history of heart disease  Z82.49         RECOMMENDATIONS: Markisha Meding is a 61 y.o. female whose past medical history and cardiac risk factors include: Hypertension, type 2 diabetes without insulin, hypothyroidism, hypercholesterolemia, postmenopausal female.  Initially referred to the  practice for evaluation of palpitations.  Overall work-up is unremarkable.  She reduced her caffeinated beverages and her symptoms have improved significantly.  Over the last 1 year patient denies anginal discomfort or heart failure symptoms.  She has had a recent cardiovascular work-up including an echo and stress test as noted above.  No additional testing warranted at this time as she is asymptomatic.  She has worked on lifestyle changes and her LDL levels have improved compared to the last office visit.  Her LDLs are now less than 70 mg/dL.  Outside labs from June 2023 independently reviewed from care everywhere.  And noted above for further reference.  Patient had a coronary calcium score in 2017 which was 0.  In 2017 patient was nondiabetic.  She would like to have her coronary calcium score readdressed since she has developed other comorbidities since the last study.  We will schedule the coronary calcium score prior to her next office visit.  Since I am not actively managing any comorbid conditions recommended either an or as needed.  Patient would feel comfortable following up annually after her well visit with PCP.  She is more than welcome to call or come in sooner if change in clinical status.  FINAL MEDICATION LIST END OF ENCOUNTER: No orders of the defined types were placed in this encounter.   Medications Discontinued During This Encounter  Medication Reason   budesonide (RHINOCORT AQUA) 32 MCG/ACT nasal spray    atorvastatin (LIPITOR) 20 MG tablet    lisinopril (PRINIVIL,ZESTRIL) 20 MG tablet Dose change   montelukast (SINGULAIR) 10 MG tablet       Current Outpatient Medications:    atorvastatin (LIPITOR) 20 MG tablet, Take 1 tablet (20 mg total) by mouth at bedtime., Disp: 90 tablet, Rfl: 3   cetirizine (ZYRTEC ALLERGY) 10 MG tablet, Take 1 tablet by mouth daily., Disp: , Rfl:    FLOVENT HFA 110 MCG/ACT inhaler, Inhale 1 puff into the lungs 2 (two) times daily., Disp: , Rfl:     glucose blood test strip, Check sugar once daily, Disp: 100 each, Rfl: 3   glucose monitoring kit (FREESTYLE) monitoring kit, 1 each by Does not apply route as needed for other., Disp: 1 each, Rfl: 0   Lancets (FREESTYLE) lancets, Check once daily, Disp: 100 each, Rfl: 5   levothyroxine (SYNTHROID, LEVOTHROID) 75 MCG tablet, Take 1 tablet (75 mcg total) by mouth daily., Disp: 90 tablet, Rfl: 3   lisinopril (ZESTRIL) 10 MG tablet, Take 1 tablet by mouth daily., Disp: , Rfl:    montelukast (SINGULAIR) 10 MG tablet, Take 10 mg by mouth daily., Disp: , Rfl:   Orders Placed This Encounter  Procedures   CT CARDIAC SCORING ([facility] LOCATIONS ONLY)   EKG 12-Lead   There are no Patient Instructions on file for this visit.   --Continue cardiac medications as reconciled in final medication list. --Return in about 1 year (around 05/21/2023) for Annual follow-up. Or sooner if needed. --Continue follow-up with your primary care physician regarding the management of your other chronic comorbid conditions.  Patient's questions and concerns were addressed to her satisfaction. She voices understanding of the instructions provided during this encounter.  This note was created using a voice recognition software as a result there may be grammatical errors inadvertently enclosed that do not reflect the nature of this encounter. Every attempt is made to correct such errors.  Rex Kras, [place], [facility]  Pager: [phone] Office: [phone]

## 2023-04-23 ENCOUNTER — Encounter: Payer: Self-pay | Admitting: Family Medicine

## 2023-04-23 ENCOUNTER — Ambulatory Visit
Admission: RE | Admit: 2023-04-23 | Discharge: 2023-04-23 | Disposition: A | Discharge status: Home / Self Care | Payer: No Typology Code available for payment source | Source: Ambulatory Visit | Attending: Cardiology | Admitting: Cardiology

## 2023-04-23 DIAGNOSIS — E119 Type 2 diabetes mellitus without complications: Secondary | ICD-10-CM

## 2023-04-23 DIAGNOSIS — E7800 Pure hypercholesterolemia, unspecified: Secondary | ICD-10-CM

## 2023-05-21 ENCOUNTER — Ambulatory Visit: Payer: BC Managed Care – PPO | Admitting: Cardiology

## 2023-05-25 ENCOUNTER — Encounter: Payer: Self-pay | Admitting: Cardiology

## 2023-05-25 ENCOUNTER — Ambulatory Visit: Payer: BC Managed Care – PPO | Admitting: Cardiology

## 2023-05-25 VITALS — BP 123/78 | HR 82 | Resp 16 | Ht 64.0 in | Wt 170.0 lb

## 2023-05-25 DIAGNOSIS — Z8249 Family history of ischemic heart disease and other diseases of the circulatory system: Secondary | ICD-10-CM

## 2023-05-25 DIAGNOSIS — R002 Palpitations: Secondary | ICD-10-CM

## 2023-05-25 DIAGNOSIS — R931 Abnormal findings on diagnostic imaging of heart and coronary circulation: Secondary | ICD-10-CM

## 2023-05-25 DIAGNOSIS — E7800 Pure hypercholesterolemia, unspecified: Secondary | ICD-10-CM

## 2023-05-25 DIAGNOSIS — E119 Type 2 diabetes mellitus without complications: Secondary | ICD-10-CM

## 2023-05-25 DIAGNOSIS — I10 Essential (primary) hypertension: Secondary | ICD-10-CM

## 2023-05-25 NOTE — Progress Notes (Signed)
Date:  05/25/2023   ID:  Roger Shelter, DOB 1961-02-25, MRN [id number]  PCP:  Sigmund Hazel, MD  Cardiologist:  Tessa Lerner, DO, [facility] (established care 05/20/2021) Former Cardiology Providers: Carolanne Grumbling, MD.  Date: 05/25/23 Last Office Visit: 05/20/2022  Chief Complaint  Patient presents with   Follow-up    1 year follow-up: Palpitations and CAC    HPI  Stepahnie Kleban is a 62 y.o. female whose past medical history and cardiovascular risk factors include: Minimal CAC, hypertension, type 2 diabetes without insulin, hypothyroidism, hypercholesterolemia, postmenopausal female.  Patient presents for 1 year follow-up.  In the past patient was referred to the practice for evaluation of palpitation which improved significantly after the reduction of caffeinated beverages on a daily basis.  Given her other comorbid conditions including diabetes, family history of CAD, hyperlipidemia she did undergo echo and stress test in the recent past.    Patient presents today for 1 year follow-up visit.  Since last office visit she had a coronary calcium score which noted minimal CAC with a total score of 7.5 placing her at the 67th percentile.  Clinically she denies anginal chest pain or heart failure symptoms.  Outside labs from June 2024 independently reviewed LDL slightly trending up (currently not at goal).  Overall functional capacity remains stable she is exercising on a regular basis.   With regards to palpitations patient stated the symptoms are still present.  More noticeable during school year as opposed to summer vacation.  When the symptoms do occur they resolved within 5 minutes and not associated with lightheadedness, dizziness, near-syncope or syncopal events.  She questions if this could be secondary to anxiety.  Dad had his first MI at age of 34 and second MI at age of 63.   Patient has a 3 other siblings were overall healthy and alive without any premature coronary artery disease or  sudden cardiac death.  ALLERGIES: Allergies  Allergen Reactions   Glimepiride     Other reaction(s): hypoglycemia on 2mg  and 4mg    Metformin     Other reaction(s): Dizziness    MEDICATION LIST PRIOR TO VISIT: Current Meds  Medication Sig   atorvastatin (LIPITOR) 20 MG tablet Take 1 tablet (20 mg total) by mouth at bedtime.   cetirizine (ZYRTEC ALLERGY) 10 MG tablet Take 1 tablet by mouth daily.   glucose blood test strip Check sugar once daily   glucose monitoring kit (FREESTYLE) monitoring kit 1 each by Does not apply route as needed for other.   Lancets (FREESTYLE) lancets Check once daily   levothyroxine (SYNTHROID) 88 MCG tablet Take by mouth daily before breakfast.   lisinopril (ZESTRIL) 10 MG tablet Take 1 tablet by mouth daily.   montelukast (SINGULAIR) 10 MG tablet Take 10 mg by mouth daily.   [DISCONTINUED] levothyroxine (SYNTHROID, LEVOTHROID) 75 MCG tablet Take 1 tablet (75 mcg total) by mouth daily.     PAST MEDICAL HISTORY: Past Medical History:  Diagnosis Date   Allergy    seasona   Family history of early CAD 03/27/2016   Hyperlipidemia    Hypertension    Prediabetes    Thyroid disease     PAST SURGICAL HISTORY: Past Surgical History:  Procedure Laterality Date   CESAREAN SECTION  1998 and 1999   x2    FAMILY HISTORY: The patient family history includes Diabetes in her sister; Heart attack in her father; Heart disease (age of onset: 36) in her father; Heart disease (age of onset: 71) in her  mother; Hyperlipidemia in her brother and sister; Hypertension in her brother; Hypothyroidism (age of onset: 29) in her mother; Thyroid disease in her sister.  SOCIAL HISTORY:  The patient  reports that she has never smoked. She has never used smokeless tobacco. She reports that she does not drink alcohol and does not use drugs.  REVIEW OF SYSTEMS: Review of Systems  Constitutional: Negative for chills and fever.  HENT:  Negative for hoarse voice and nosebleeds.    Eyes:  Negative for discharge, double vision and pain.  Cardiovascular:  Negative for chest pain, claudication, dyspnea on exertion, leg swelling, near-syncope, orthopnea, palpitations, paroxysmal nocturnal dyspnea and syncope.  Respiratory:  Negative for hemoptysis and shortness of breath.   Musculoskeletal:  Negative for muscle cramps and myalgias.  Gastrointestinal:  Negative for abdominal pain, constipation, diarrhea, hematemesis, hematochezia, melena, nausea and vomiting.  Neurological:  Negative for dizziness and light-headedness.    PHYSICAL EXAM:    05/25/2023    3:58 PM 05/20/2022    1:29 PM 07/17/2021   10:41 AM  Vitals with BMI  Height 5\' 4"  5\' 4"  5\' 4"   Weight 170 lbs 166 lbs 170 lbs  BMI 29.17 28.48 29.17  Systolic 123 130 401  Diastolic 78 78 82  Pulse 82 87 70    Physical Exam  Constitutional: No distress.  Age appropriate, hemodynamically stable.   Neck: No JVD present.  Cardiovascular: Normal rate, regular rhythm, S1 normal, S2 normal, intact distal pulses and normal pulses. Exam reveals no gallop, no S3 and no S4.  No murmur heard. Varicose veins noted bilaterally.  Pulmonary/Chest: Effort normal and breath sounds normal. No stridor. She has no wheezes. She has no rales.  Abdominal: Soft. Bowel sounds are normal. She exhibits no distension. There is no abdominal tenderness.  Musculoskeletal:        General: No edema.     Cervical back: Neck supple.  Neurological: She is alert and oriented to person, place, and time. She has intact cranial nerves (2-12).  Skin: Skin is warm and moist.   CARDIAC DATABASE: EKG: 05/25/2023: Sinus rhythm, 80 bpm, consider old anteroseptal infarct, without underlying injury pattern.  No significant change compared to prior tracing 05/20/2022  Echocardiogram: 05/22/2021: Normal LV systolic function with visual EF 60-65%. Left ventricle cavity is normal in size. Normal left ventricular wall thickness. Normal global wall motion. Normal  diastolic filling pattern, normal LAP.  No significant valvular heart disease. No prior study for comparison.   Stress Testing: Exercise treadmill stress test 07/12/2021: Functional status: Average. Chest pain: No. Reason for stopping exercise: Fatigue and weakness. Hypertensive response to exercise: No. Exercise time 9 minutes 00 seconds on Bruce protocol, achieved 10.16 METS, 104% of APMHR. Stress ECG negative for ischemia.   Low risk study.   CAC Score:  04/18/2016 Total Score 0.   04/23/2023: Total CAC 7.5, 67th percentile.  No significant incidental noncardiac findings.  Heart Catheterization: None  14 day extended Holter monitor: 06/07/2021 Dominant rhythm normal sinus, followed by sinus tachycardia (13% burden). Heart rate 53-141 bpm. Avg HR 81 bpm. No atrial fibrillation, supraventricular tachycardia, ventricular tachycardia, high grade AV block, pauses (3 seconds or longer). Total ventricular ectopic burden <1% (isolated PVCs, PVCs and either bigeminy or trigeminy pattern).  Total supraventricular ectopic burden <1%. Patient triggered events: 9. Underlying rhythm normal sinus with episodes of ventricular ectopy.  LABORATORY DATA:    Latest Ref Rng & Units 09/17/2018   11:31 AM 04/02/2018   11:43 AM 10/12/2017  11:36 AM  CBC  WBC 3.4 - 10.8 x10E3/uL 5.4  5.2  5.4   Hemoglobin 11.1 - 15.9 g/dL 56.2  13.0  86.5   Hematocrit 34.0 - 46.6 % 38.8  40.1  40.8   Platelets 150 - 450 x10E3/uL 327  316  350        Latest Ref Rng & Units 09/17/2018   11:31 AM 04/02/2018   11:43 AM 10/12/2017   11:36 AM  CMP  Glucose 65 - 99 mg/dL 94  784  90   BUN 6 - 24 mg/dL 8  8  9    Creatinine 0.57 - 1.00 mg/dL 6.96  2.95  2.84   Sodium 134 - 144 mmol/L 138  140  140   Potassium 3.5 - 5.2 mmol/L 4.7  4.9  4.7   Chloride 96 - 106 mmol/L 99  101  101   CO2 20 - 29 mmol/L 22  25  22    Calcium 8.7 - 10.2 mg/dL 9.6  9.5  9.5   Total Protein 6.0 - 8.5 g/dL 7.3  7.0  7.3   Total  Bilirubin 0.0 - 1.2 mg/dL 0.5  0.4  0.4   Alkaline Phos 39 - 117 IU/L 74  68  70   AST 0 - 40 IU/L 33  28  30   ALT 0 - 32 IU/L 30  30  31      Lipid Panel  Lab Results  Component Value Date   CHOL 147 09/17/2018   HDL 47 09/17/2018   LDLCALC 81 09/17/2018   TRIG 95 09/17/2018   CHOLHDL 3.1 09/17/2018    No components found for: "NTPROBNP" No results for input(s): "PROBNP" in the last 8760 hours. No results for input(s): "TSH" in the last 8760 hours.  BMP No results for input(s): "NA", "K", "CL", "CO2", "GLUCOSE", "BUN", "CREATININE", "CALCIUM", "GFRNONAA", "GFRAA" in the last 8760 hours.  HEMOGLOBIN A1C Lab Results  Component Value Date   HGBA1C 6.1 (H) 09/17/2018   MPG 117 08/22/2016   Lipids: 09/17/2018: Total cholesterol 147, triglycerides 95, HDL 47, LDL calculated 81 March 22, 2021: Total cholesterol 158, triglycerides 157, LDL 95, HDL 36, non-HDL 122 March 31, 2022: Total cholesterol 124, triglycerides 92, HDL 41, calculated LDL 66 March 18, 2023: Total cholesterol 145, triglycerides 114, HDL 44, LDL calculated 80, non-HDL 101  Results Component Value Reference Range Notes  Lipid Panel w/reflex Reviewed date:03/18/2023 04:03:50 PM Interpretation:LDL 80 Performing Lab: Notes/Report: Testing Performed at: [facility], [address], [address], [place], [place] [address]  Cholesterol 145 <200 mg/dL    CHOL/HDL 3.3 0.1-0.2 Ratio    HDLD 44 30-85 mg/dL Values below 40 mg/dL indicate increased risk factor  Triglyceride 114 0-199 mg/dL    NHDL 725 3-664 mg/dL Range dependent upon risk factors.  LDL Chol Calc (NIH) 80 0-99 mg/dL    Hemoglobin Q0H Reviewed date:03/18/2023 04:03:50 PM Interpretation:6.5 at goal Performing Lab: Notes/Report: Testing Performed at: [facility], [address], [address], [place], [place] [address]  eAG 140      Hgb A1c 6.5 4.8-5.6 % Prediabetes: 5.7-6.4%  Diabetes: >/= 6.5%   TSH Reviewed date:03/18/2023 04:01:36  PM Interpretation:Normal Performing Lab: Notes/Report: Testing Performed at: [facility], [address], [place], [place] [address]  TSH 2.96 0.34-4.50 UlU/mL    Comp Metabolic Panel Reviewed date:03/18/2023 04:03:50 PM Interpretation:Normal Performing Lab: Notes/Report: Testing Performed at: [facility], [address], [address], [place], [place] [address]  Glucose 90 70-99 mg/dL  BUN 10 6-26 mg/dL    Creatinine 1.30 8.65-7.84 mg/dl    [id number] >24 calc In accordance with recommendations from NKF-ASN Task Force, [facility] has updated its eGFR calc to the 2021 CKD-EDI equation that estimates kidney function without a race variable;Stage 1 > 90 ML/Min plus Albuminuria;Stage 2 60-89 ML/MIN;Stage 3 30-59 ML/MIN;Stage 4 15-29 ML/MIN;Stage 5 <15 ML/MIN  Sodium 137 136-145 mmol/L    Potassium 4.6 3.5-5.5 mmol/L    Chloride 101 98-107 mmol/L    CO2 29 22-32 mmol/L    Anion Gap 11.6 6.0-20.0 mmol/L    Calcium 9.7 8.6-10.3 mg/dL    CA-corrected 4.01 0.27-25.36 mg/dL    Protein, Total 7.4 6.4-4.0 g/dL    Albumin 4.4 3.4-7.4 g/dL    TBIL 0.7 2.5-9.5 mg/dL    ALP 61 63-875 U/L    AST 33 0-39 U/L    ALT 34 0-52 U/L      IMPRESSION:    ICD-10-CM   1. Palpitations  R00.2 EKG 12-Lead    2. Agatston coronary artery calcium score less than 100  R93.1     3. Benign hypertension  I10     4. Pure hypercholesterolemia  E78.00     5. Type 2 diabetes mellitus without complication, without long-term current use of insulin (HCC)  E11.9     6. Family history of heart disease  Z82.49         RECOMMENDATIONS: Krysty Hulke is a 62 y.o. female whose past medical history and cardiac risk factors include: Hypertension, type 2 diabetes without insulin, hypothyroidism, hypercholesterolemia, postmenopausal female.  Palpitations Chronic and stable. More noticeable during the school year as opposed to some education. Symptoms are usually at best less than 5 minutes. No associated  near-syncope or syncopal events. Has undergone cardiac monitor in the past -results reviewed as part of medical decision making. In the past she has cut down caffeinated beverages which has helped her symptoms. Patient thinks underlying anxiety can also be contributing to her current symptoms as they are more prominent during the school year or instances when she starts to worry/flight of thoughts.  I have asked her to discuss this further with PCP and consider a trial of low-dose anxiolytics which is not habit-forming. If symptoms increase in intensity frequency and/or duration we will consider repeat Zio patch.  She will call us back.  Agatston coronary artery calcium score less than 100 04/18/2016 Total Score 0.  04/23/2023: Total CAC 7.5, 67th percentile. Denies anginal chest pain. Has undergone ischemic workup in the past. Overall functional capacity remains stable. EKG does not show concerns for dynamic changes or injury pattern. Shared decision was to hold off on additional workup. Reemphasized the importance of secondary prevention with focus on improving her modifiable cardiovascular risk factors such as glycemic control, lipid management, blood pressure control, weight loss.  Benign hypertension Office blood pressures are well-controlled. Continue current medical therapy  Pure hypercholesterolemia Outside labs from June 2024 independently reviewed from care everywhere. Given her CAC and diabetes recommend a goal LDL of at least <70 mg/dL. Shared decision was to implement lifestyle changes and have her lab work rechecked with PCP.  If LDL still not at goal would recommend addition of Zetia 10 mg p.o. daily  Type 2 diabetes mellitus without complication, without long-term current use of insulin (HCC) Currently being managed by PCP.  FINAL MEDICATION LIST END OF ENCOUNTER: No orders of the defined types were placed in this encounter.   Medications Discontinued During This  Encounter  Medication Reason   FLOVENT HFA 110 MCG/ACT inhaler    levothyroxine (SYNTHROID, LEVOTHROID) 75 MCG tablet Change in therapy      Current Outpatient Medications:    atorvastatin (LIPITOR) 20 MG tablet, Take 1 tablet (20 mg total) by mouth at bedtime., Disp: 90 tablet, Rfl: 3   cetirizine (ZYRTEC ALLERGY) 10 MG tablet, Take 1 tablet by mouth daily., Disp: , Rfl:    glucose blood test strip, Check sugar once daily, Disp: 100 each, Rfl: 3   glucose monitoring kit (FREESTYLE) monitoring kit, 1 each by Does not apply route as needed for other., Disp: 1 each, Rfl: 0   Lancets (FREESTYLE) lancets, Check once daily, Disp: 100 each, Rfl: 5   levothyroxine (SYNTHROID) 88 MCG tablet, Take by mouth daily before breakfast., Disp: , Rfl:    lisinopril (ZESTRIL) 10 MG tablet, Take 1 tablet by mouth daily., Disp: , Rfl:    montelukast (SINGULAIR) 10 MG tablet, Take 10 mg by mouth daily., Disp: , Rfl:   Orders Placed This Encounter  Procedures   EKG 12-Lead   There are no Patient Instructions on file for this visit.   --Continue cardiac medications as reconciled in final medication list. --Return in about 1 year (around 05/24/2024) for Follow up, Coronary artery calcification. Or sooner if needed. --Continue follow-up with your primary care physician regarding the management of your other chronic comorbid conditions.  Patient's questions and concerns were addressed to her satisfaction. She voices understanding of the instructions provided during this encounter.   This note was created using a voice recognition software as a result there may be grammatical errors inadvertently enclosed that do not reflect the nature of this encounter. Every attempt is made to correct such errors.  Tessa Lerner, [place], [facility]  Pager:  [phone] Office: [phone]

## 2023-08-17 ENCOUNTER — Other Ambulatory Visit (HOSPITAL_COMMUNITY): Payer: Self-pay | Admitting: Pain Medicine

## 2023-08-17 DIAGNOSIS — R1030 Lower abdominal pain, unspecified: Secondary | ICD-10-CM

## 2023-08-17 DIAGNOSIS — M545 Low back pain, unspecified: Secondary | ICD-10-CM

## 2023-08-18 ENCOUNTER — Ambulatory Visit (HOSPITAL_COMMUNITY)
Admission: RE | Admit: 2023-08-18 | Discharge: 2023-08-18 | Disposition: A | Discharge status: Home / Self Care | Payer: BC Managed Care – PPO | Source: Ambulatory Visit | Attending: Pain Medicine | Admitting: Pain Medicine

## 2023-08-18 DIAGNOSIS — R1030 Lower abdominal pain, unspecified: Secondary | ICD-10-CM | POA: Diagnosis present

## 2023-08-18 DIAGNOSIS — M545 Low back pain, unspecified: Secondary | ICD-10-CM | POA: Diagnosis present

## 2023-08-18 MED ORDER — IOHEXOL 300 MG/ML  SOLN
100.0000 mL | Freq: Once | INTRAMUSCULAR | Status: AC | PRN
  Administered 2023-08-18: 100 mL via INTRAVENOUS

## 2023-08-24 ENCOUNTER — Other Ambulatory Visit: Payer: Self-pay | Admitting: Surgery

## 2023-08-24 DIAGNOSIS — K8020 Calculus of gallbladder without cholecystitis without obstruction: Secondary | ICD-10-CM

## 2023-09-02 ENCOUNTER — Ambulatory Visit
Admission: RE | Admit: 2023-09-02 | Discharge: 2023-09-02 | Disposition: A | Discharge status: Home / Self Care | Payer: BC Managed Care – PPO | Source: Ambulatory Visit | Attending: Surgery

## 2023-09-02 DIAGNOSIS — K8020 Calculus of gallbladder without cholecystitis without obstruction: Secondary | ICD-10-CM

## 2024-01-29 IMAGING — US US ABDOMEN COMPLETE
1 series · 13 of 25 positions shown · non-contrast
Comparison: None.

CLINICAL DATA: 60-year-old female with unintentional weight loss.

EXAM:
ABDOMEN ULTRASOUND COMPLETE

[Series 1: us abdomen complete · 0.23mm/px · 13 of 85 slices shown]
[im 1/85]
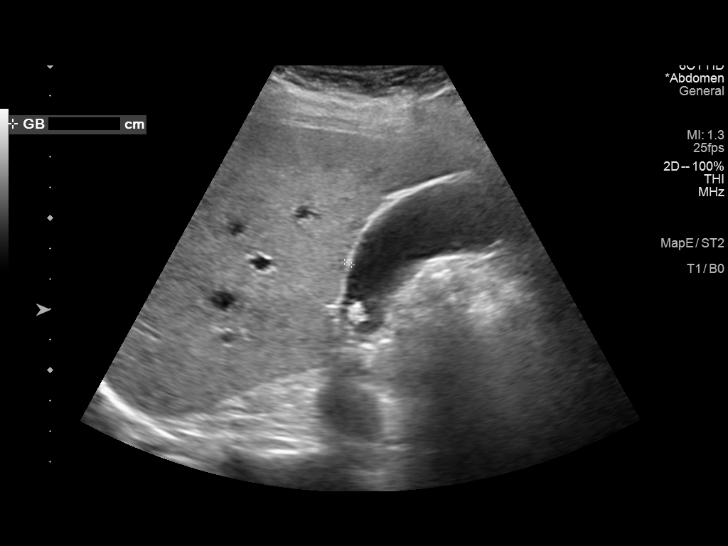
[im 8/85]
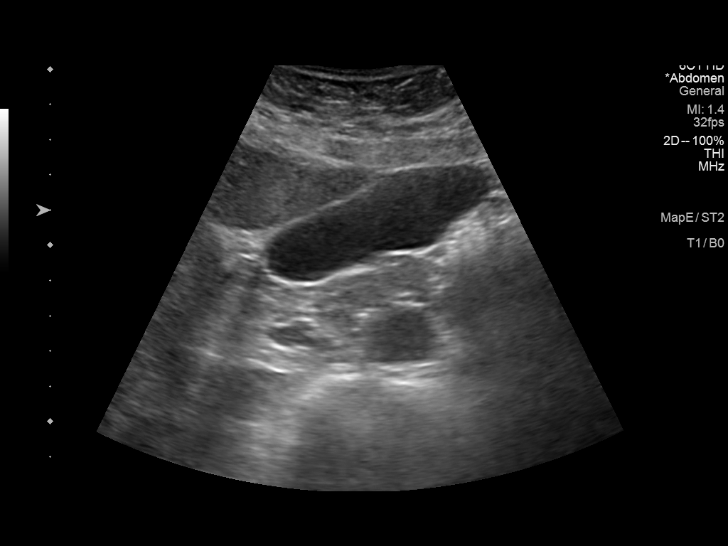
[im 15/85]
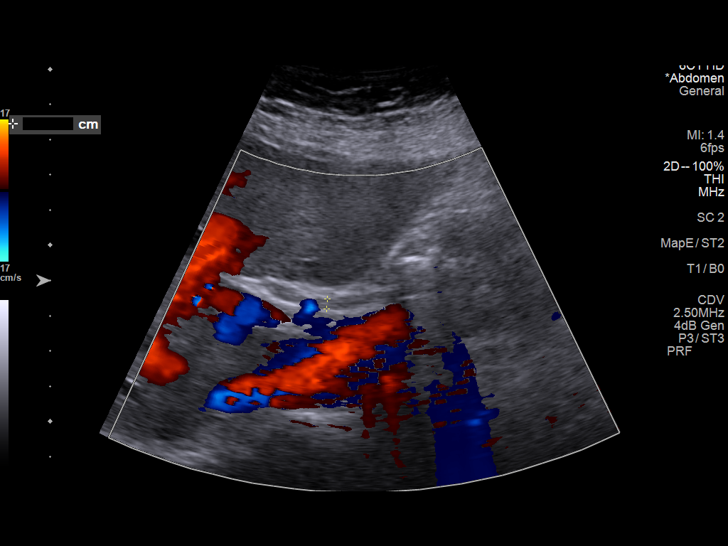
[im 22/85]
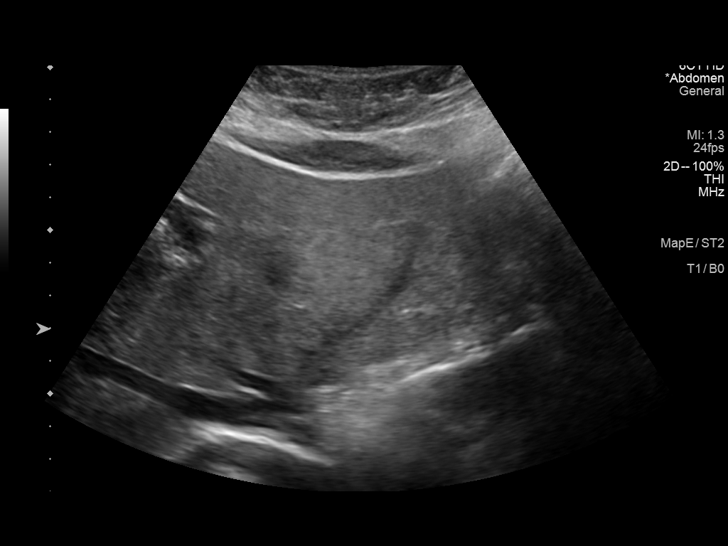
[im 29/85]
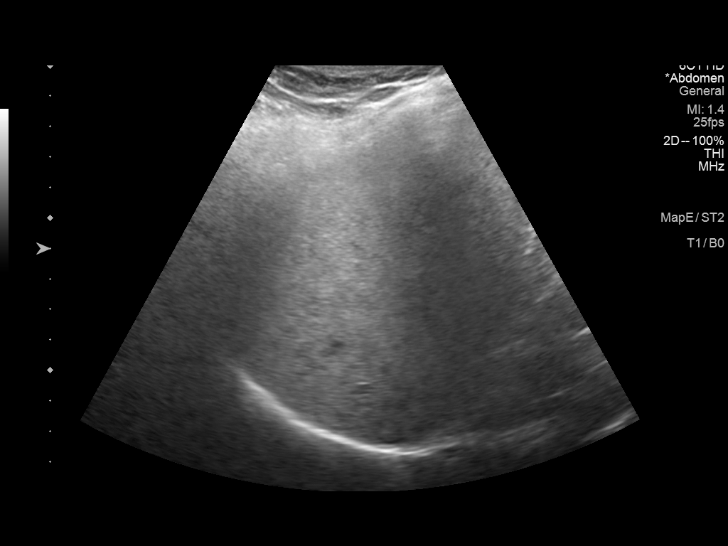
[im 36/85]
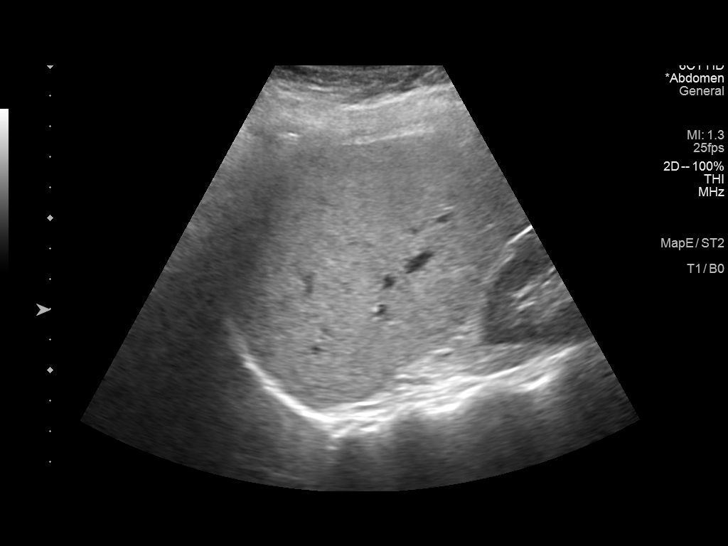
[im 43/85]
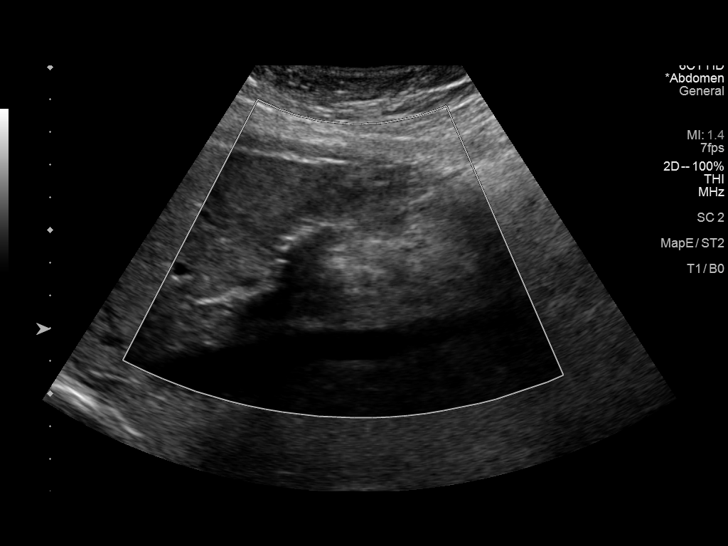
[im 50/85]
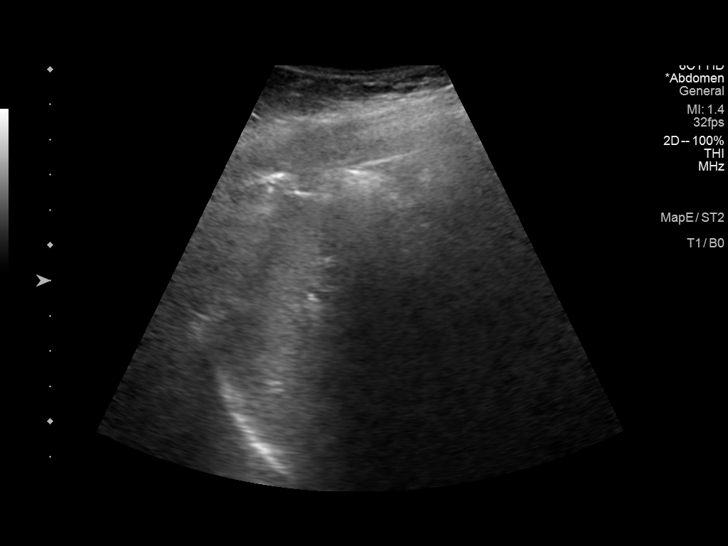
[im 57/85]
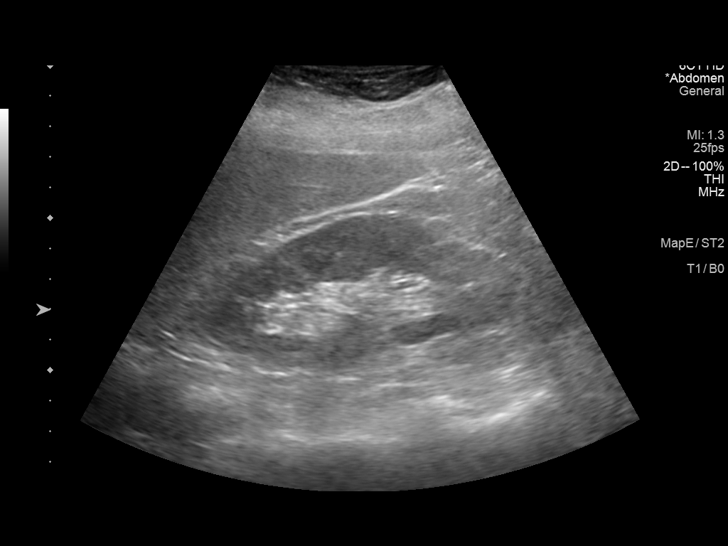
[im 64/85]
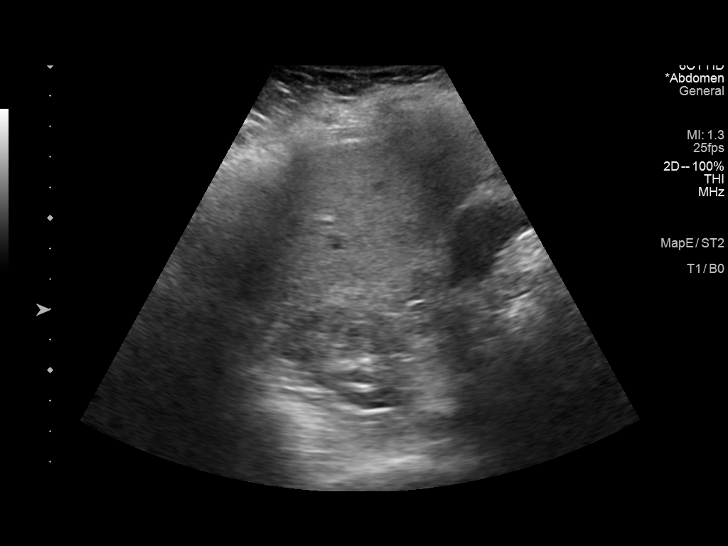
[im 71/85]
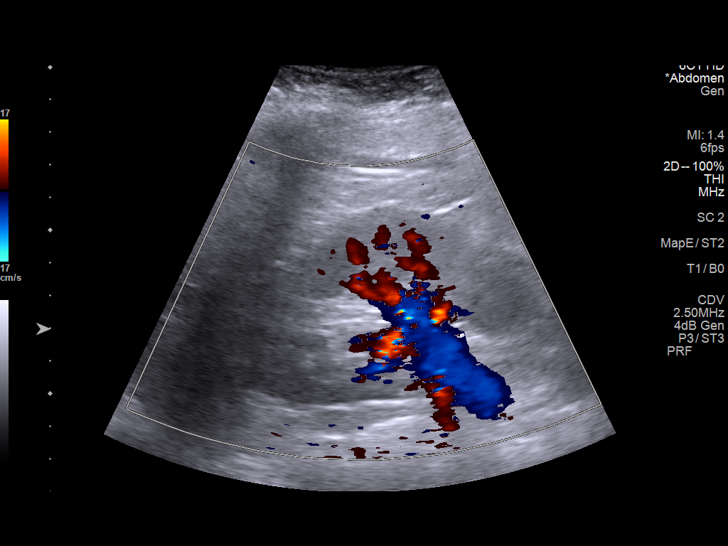
[im 78/85]
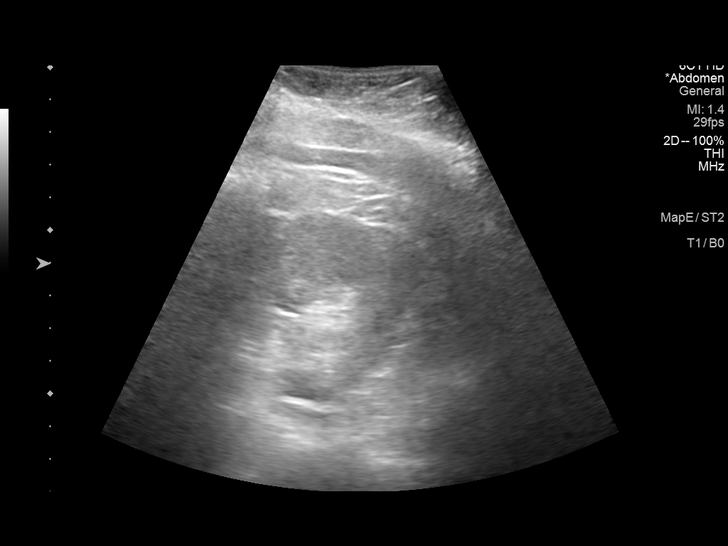
[im 85/85]
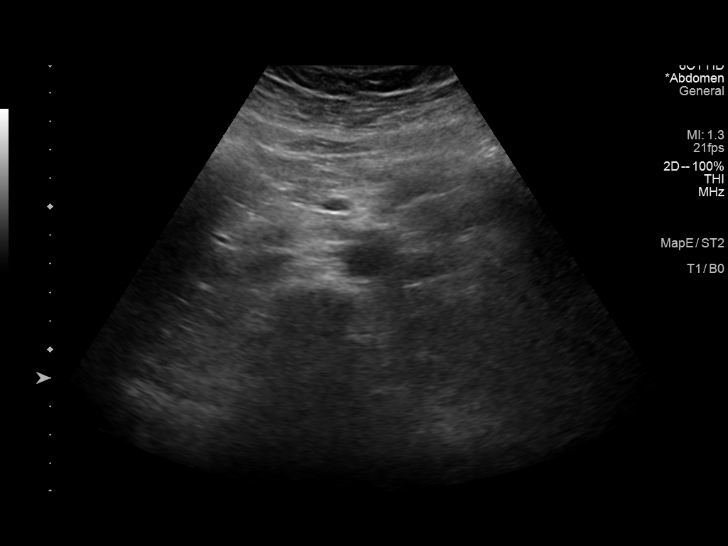

[13 of 25 positions shown; findings below may reference images not displayed]

FINDINGS: Gallbladder: Multiple echogenic but nonshadowing foci within the
gallbladder lumen (image 3 8 mm) could be nonshadowing stones or
tumefactive sludge. Possible mild additional layering sludge.
Gallbladder wall thickness remains normal at 2-3 mm. No
pericholecystic fluid. No sonographic Murphy sign elicited.

Common bile duct: Diameter: 3 mm, normal.

Liver: Echogenic liver (image 28). But no discrete liver lesion. No
intrahepatic biliary ductal dilatation. Portal vein is patent on
color Doppler imaging with normal direction of blood flow towards
the liver.

IVC: No abnormality visualized.

Pancreas: Visualized portion unremarkable.

Spleen: Size and appearance within normal limits.

Right Kidney: Length: 9.9 cm. Echogenicity within normal limits. No
mass or hydronephrosis visualized.

Left Kidney: Length: 10.5 cm. Echogenicity within normal limits. No
mass or hydronephrosis visualized.

Abdominal aorta: No aneurysm visualized.

Other findings: None.
IMPRESSION: 1. Small nonshadowing stones versus tumefactive sludge within the
gallbladder. No evidence of acute cholecystitis or bile duct
obstruction.
2. Evidence of Fatty Liver Disease.
3. Otherwise negative ultrasound appearance of the abdomen.

## 2024-02-10 IMAGING — CR DG CHEST 2V
2 series · 2 of 2 positions shown · non-contrast
Comparison: None.

CLINICAL DATA: Weight loss

EXAM:
CHEST - 2 VIEW

[w chest pa]
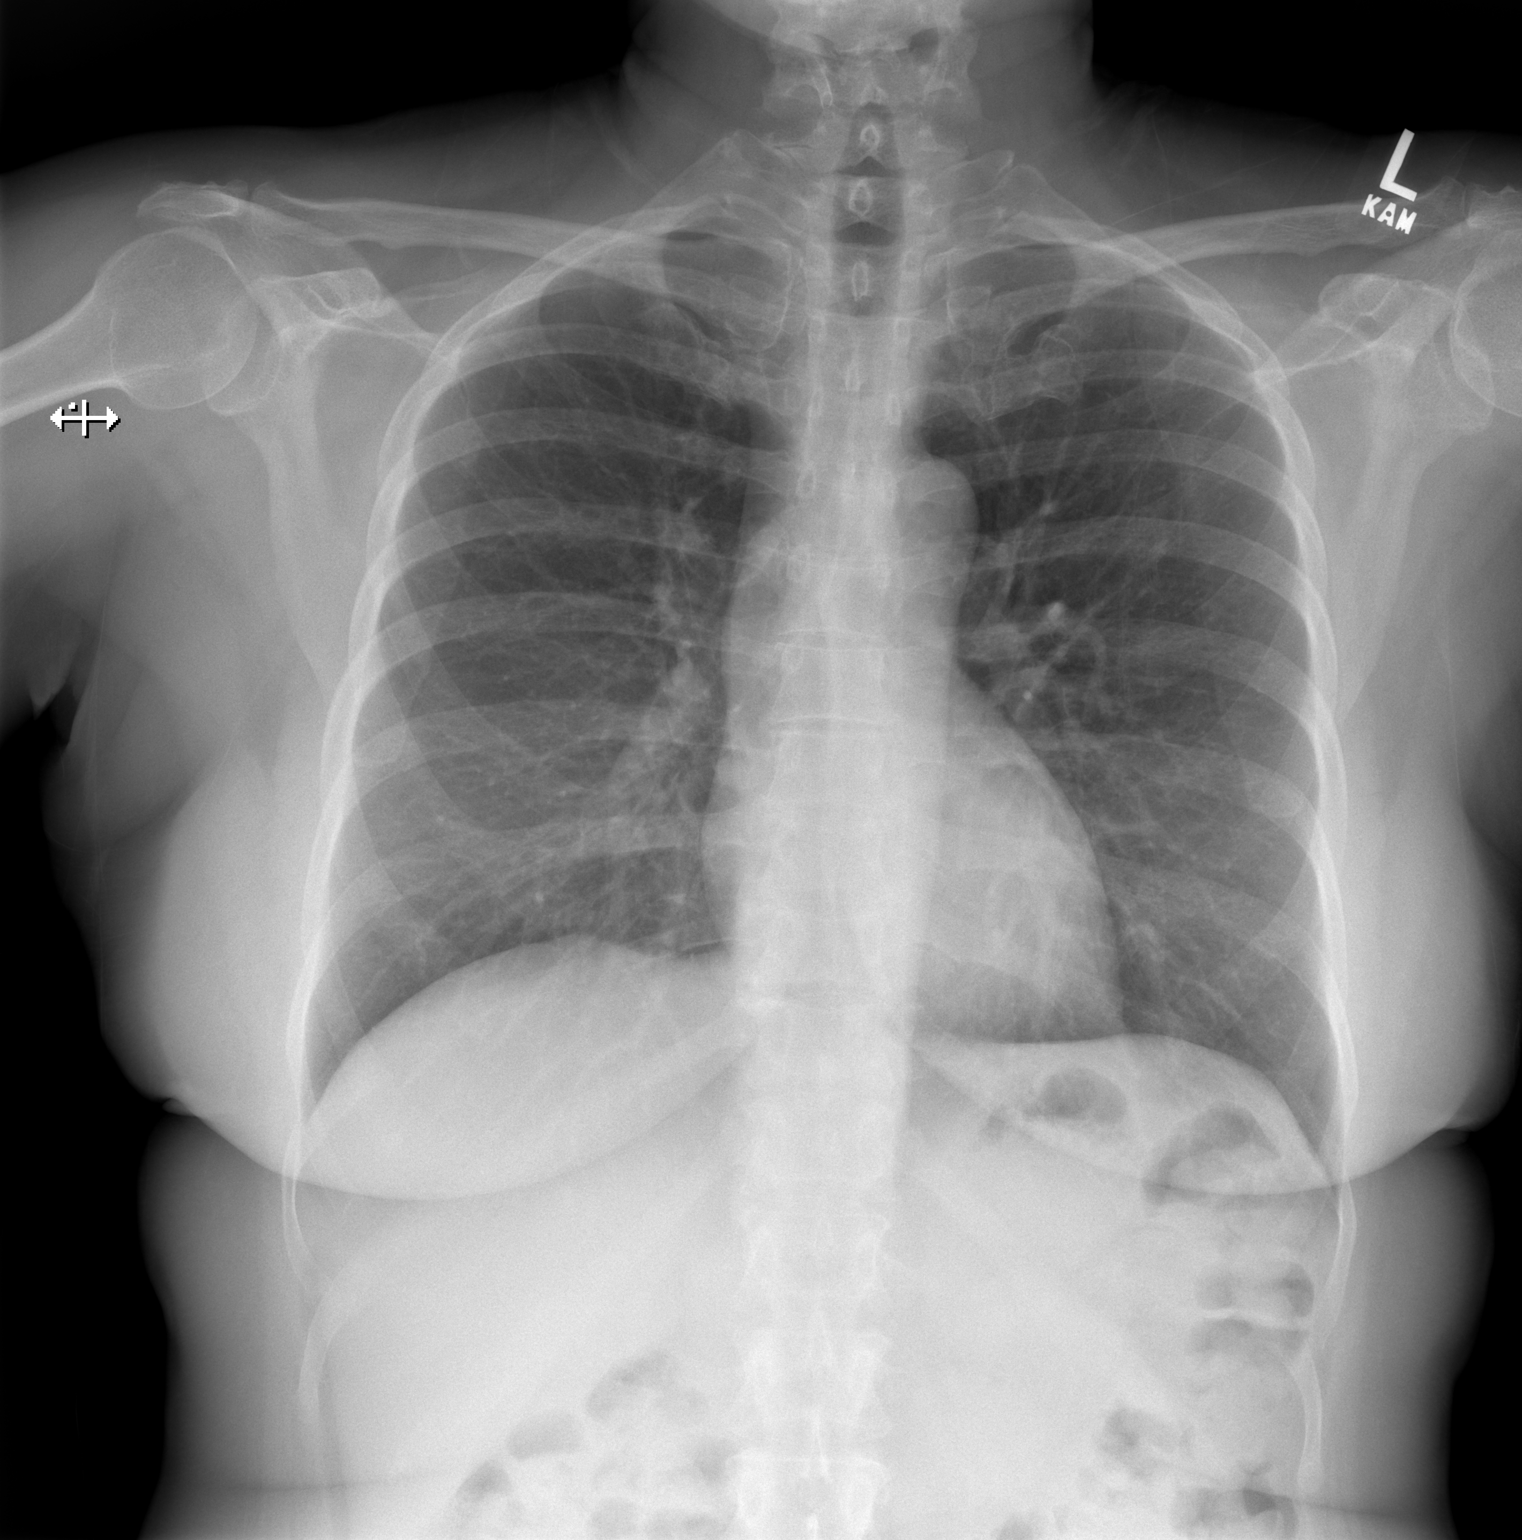

[w chest lat]
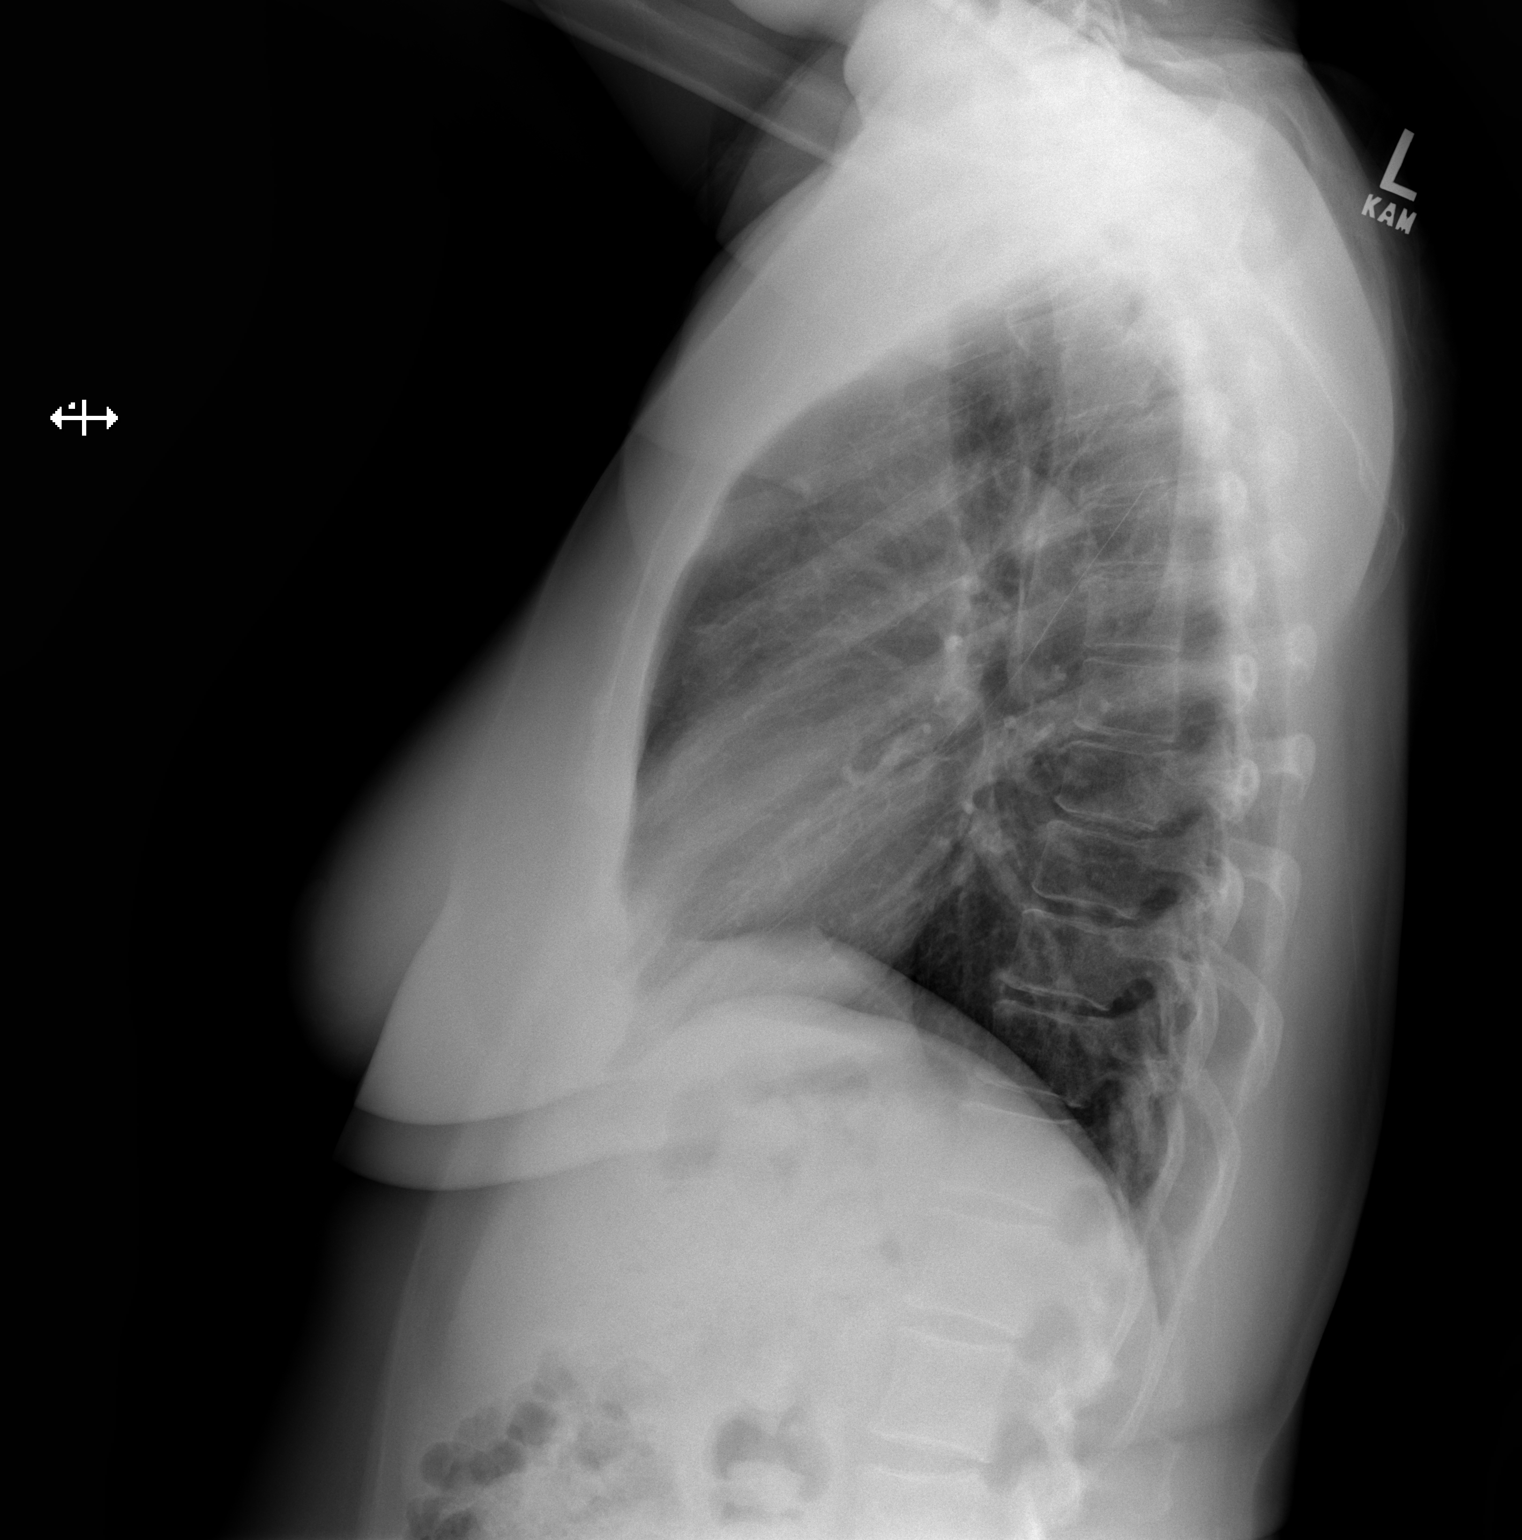

[2 of 2 positions shown; findings below may reference images not displayed]

FINDINGS: The heart and mediastinal contours are within normal limits.

No focal consolidation. No pulmonary edema. No pleural effusion. No
pneumothorax.

No acute osseous abnormality.
IMPRESSION: No active cardiopulmonary disease.

## 2024-05-24 ENCOUNTER — Ambulatory Visit: Payer: Self-pay | Admitting: Cardiology

## 2024-05-27 ENCOUNTER — Ambulatory Visit: Payer: Self-pay | Attending: Cardiology | Admitting: Cardiology

## 2024-05-27 ENCOUNTER — Encounter: Payer: Self-pay | Admitting: Cardiology

## 2024-05-27 VITALS — BP 109/72 | HR 74 | Resp 16 | Ht 64.0 in | Wt 166.0 lb

## 2024-05-27 DIAGNOSIS — R931 Abnormal findings on diagnostic imaging of heart and coronary circulation: Secondary | ICD-10-CM

## 2024-05-27 DIAGNOSIS — I10 Essential (primary) hypertension: Secondary | ICD-10-CM | POA: Diagnosis not present

## 2024-05-27 DIAGNOSIS — E119 Type 2 diabetes mellitus without complications: Secondary | ICD-10-CM

## 2024-05-27 DIAGNOSIS — R002 Palpitations: Secondary | ICD-10-CM | POA: Diagnosis not present

## 2024-05-27 DIAGNOSIS — E7800 Pure hypercholesterolemia, unspecified: Secondary | ICD-10-CM

## 2024-05-27 DIAGNOSIS — Z8249 Family history of ischemic heart disease and other diseases of the circulatory system: Secondary | ICD-10-CM

## 2024-05-27 NOTE — Progress Notes (Signed)
 Cardiology Office Note:  .   Date:  05/27/2024  ID:  Alice Anderson, DOB Jun 21, 1961, MRN [id number] PCP:  Cleotilde Planas, MD  Former Cardiology Providers: Randine Bihari, MD.  [facility] Providers Cardiologist:  Madonna Large, DO , [facility] (established care 05/20/2021) Electrophysiologist:  None  Click to update primary MD,subspecialty MD or APP then REFRESH:1}    Chief Complaint  Patient presents with   Follow-up    1 year follow up - CAC and palpitations    History of Present Illness: .   Alice Anderson is a 63 y.o. female whose past medical history and cardiovascular risk factors includes: Minimal CAC, hypertension, type 2 diabetes without insulin, hypothyroidism, hypercholesterolemia.  Patient was initially referred to the practice for palpitations which have significantly improved after reduction in caffeinated beverages.  Given her family history of CAD, diabetes, hyperlipidemia she did undergo stress test in the past results mentioned below.  She presents today for 1 year follow-up visit.  Since last office visit patient denies any anginal chest pain or heart failure symptoms.  No hospitalizations or urgent care visits for cardiovascular reasons.  She has been compliant with her medical therapy.  She has lost about 4 pounds since the last office visit. Physical endurance remains stable. Palpitations are well controlled as long as her coffee intake is well balanced and anxiety levels are controlled. Working w/ PCP to optimize her thyroid  function.   Review of Systems: .   Review of Systems  Cardiovascular:  Negative for chest pain, claudication, irregular heartbeat, leg swelling, near-syncope, orthopnea, palpitations, paroxysmal nocturnal dyspnea and syncope.  Respiratory:  Negative for shortness of breath.   Hematologic/Lymphatic: Negative for bleeding problem.    Studies Reviewed:   EKG: EKG Interpretation Date/Time:  Friday May 27 2024 10:55:02 EDT Ventricular Rate:   74 PR Interval:  128 QRS Duration:  86 QT Interval:  362 QTC Calculation: 401 R Axis:   63  Text Interpretation: Normal sinus rhythm Normal ECG No previous ECGs available Confirmed by Large Madonna [phone]) on 05/27/2024 10:58:52 AM  Echocardiogram: 05/22/2021: Normal LV systolic function with visual EF 60-65%. Left ventricle cavity is normal in size. Normal left ventricular wall thickness. Normal global wall motion. Normal diastolic filling pattern, normal LAP.  No significant valvular heart disease. No prior study for comparison.  Stress Testing: Exercise treadmill stress test 07/12/2021: Functional status: Average. Chest pain: No. Reason for stopping exercise: Fatigue and weakness. Hypertensive response to exercise: No. Exercise time 9 minutes 00 seconds on Bruce protocol, achieved 10.16 METS, 104% of APMHR. Stress ECG negative for ischemia.   Low risk study.  CAC Score:  04/18/2016 Total Score 0.    04/23/2023: Total CAC 7.5, 67th percentile.  No significant incidental noncardiac findings.  14 day extended Holter monitor: 06/07/2021 Dominant rhythm normal sinus, followed by sinus tachycardia (13% burden). Heart rate 53-141 bpm. Avg HR 81 bpm. No atrial fibrillation, supraventricular tachycardia, ventricular tachycardia, high grade AV block, pauses (3 seconds or longer). Total ventricular ectopic burden <1% (isolated PVCs, PVCs and either bigeminy or trigeminy pattern).  Total supraventricular ectopic burden <1%. Patient triggered events: 9. Underlying rhythm normal sinus with episodes of ventricular ectopy.  RADIOLOGY: NA  Risk Assessment/Calculations:   NA   Labs:       Latest Ref Rng & Units 09/17/2018   11:31 AM 04/02/2018   11:43 AM 10/12/2017   11:36 AM  CBC  WBC 3.4 - 10.8 x10E3/uL 5.4  5.2  5.4   Hemoglobin 11.1 -  15.9 g/dL 87.1  86.5  86.5   Hematocrit 34.0 - 46.6 % 38.8  40.1  40.8   Platelets 150 - 450 x10E3/uL 327  316  350        Latest Ref Rng &  Units 09/17/2018   11:31 AM 04/02/2018   11:43 AM 10/12/2017   11:36 AM  BMP  Glucose 65 - 99 mg/dL 94  899  90   BUN 6 - 24 mg/dL 8  8  9    Creatinine 0.57 - 1.00 mg/dL 9.30  9.27  9.28   BUN/Creat Ratio 9 - 23 12  11  13    Sodium 134 - 144 mmol/L 138  140  140   Potassium 3.5 - 5.2 mmol/L 4.7  4.9  4.7   Chloride 96 - 106 mmol/L 99  101  101   CO2 20 - 29 mmol/L 22  25  22    Calcium  8.7 - 10.2 mg/dL 9.6  9.5  9.5       Latest Ref Rng & Units 09/17/2018   11:31 AM 04/02/2018   11:43 AM 10/12/2017   11:36 AM  CMP  Glucose 65 - 99 mg/dL 94  899  90   BUN 6 - 24 mg/dL 8  8  9    Creatinine 0.57 - 1.00 mg/dL 9.30  9.27  9.28   Sodium 134 - 144 mmol/L 138  140  140   Potassium 3.5 - 5.2 mmol/L 4.7  4.9  4.7   Chloride 96 - 106 mmol/L 99  101  101   CO2 20 - 29 mmol/L 22  25  22    Calcium  8.7 - 10.2 mg/dL 9.6  9.5  9.5   Total Protein 6.0 - 8.5 g/dL 7.3  7.0  7.3   Total Bilirubin 0.0 - 1.2 mg/dL 0.5  0.4  0.4   Alkaline Phos 39 - 117 IU/L 74  68  70   AST 0 - 40 IU/L 33  28  30   ALT 0 - 32 IU/L 30  30  31      Lab Results  Component Value Date   CHOL 147 09/17/2018   HDL 47 09/17/2018   LDLCALC 81 09/17/2018   TRIG 95 09/17/2018   CHOLHDL 3.1 09/17/2018   No results for input(s): LIPOA in the last 8760 hours. No components found for: NTPROBNP No results for input(s): PROBNP in the last 8760 hours. No results for input(s): TSH in the last 8760 hours.  Physical Exam:    Today's Vitals   05/27/24 1052  BP: 109/72  Pulse: 74  Resp: 16  SpO2: 94%  Weight: 166 lb (75.3 kg)  Height: 5' 4 (1.626 m)   Body mass index is 28.49 kg/m. Wt Readings from Last 3 Encounters:  05/27/24 166 lb (75.3 kg)  05/25/23 170 lb (77.1 kg)  05/20/22 166 lb (75.3 kg)    Physical Exam  Constitutional: No distress.  Age appropriate, hemodynamically stable.   Neck: No JVD present.  Cardiovascular: Normal rate, regular rhythm, S1 normal, S2 normal, intact distal pulses and normal  pulses. Exam reveals no gallop, no S3 and no S4.  No murmur heard. Varicose veins noted bilaterally.  Pulmonary/Chest: Effort normal and breath sounds normal. No stridor. She has no wheezes. She has no rales.  Abdominal: Soft. Bowel sounds are normal. She exhibits no distension. There is no abdominal tenderness.  Musculoskeletal:        General: No edema.     Cervical back:  Neck supple.  Neurological: She is alert and oriented to person, place, and time. She has intact cranial nerves (2-12).  Skin: Skin is warm and moist.     Impression & Recommendation(s):  Impression:   ICD-10-CM   1. Agatston coronary artery calcium  score less than 100  R93.1 EKG 12-Lead    2. Palpitations  R00.2     3. Benign hypertension  I10 ECHOCARDIOGRAM COMPLETE    4. Pure hypercholesterolemia  E78.00     5. Type 2 diabetes mellitus without complication, without long-term current use of insulin (HCC)  E11.9     6. Family history of heart disease  Z82.49        Recommendation(s):  Agatston coronary artery calcium  score less than 100 04/18/2016 Total Score 0.  04/23/2023: Total CAC 7.5, 67th percentile. Denies anginal chest pain or heart failure symptoms. Has undergone appropriate ischemic workup in the past. Overall physical endurance remains relatively stable. Patient applying agents.  Palpitations Chronic and stable. Usually provide anxiety and increased number of caffeinated beverages.  Benign hypertension Office blood pressures are soft. She remains asymptomatic currently on lisinopril  10 mg p.o. daily Cardiology following peripherally, managed by primary care provider. Echo will be ordered prior to next office visit to evaluate for structural heart disease and left ventricular systolic function.  Pure hypercholesterolemia Currently on Lipitor 20 mg p.o. daily. Her most recent profile from June 2025 independently reviewed from [facility] database, LDL 82 mg/dL and triglycerides 836 mg/dL. Given  her comorbidities would recommend a goal LDL <70 mg/dL and triglycerides <850 mg/dL  Type 2 diabetes mellitus without complication, without long-term current use of insulin (HCC) Reemphasized importance of glycemic control. Currently on statin therapy, ACE inhibitors  Recommended follow-up ongoing basis; however, if she would like to be followed on an basis.  Orders Placed:  Orders Placed This Encounter  Procedures   EKG 12-Lead   ECHOCARDIOGRAM COMPLETE    Standing Status:   Future    Expected Date:   05/08/2025    Where should this test be performed[facility]    Does the patient weigh less than or greater than 250 lbs?:   Patient weighs less than 250 lbs    Perflutren DEFINITY (image enhancing agent) should be administered unless hypersensitivity or allergy exist:   Administer Perflutren    Reason for exam-Echo:   Other-Full Diagnosis List    Full ICD-10/Reason for Exam:   HTN (hypertension) [761881]     Final Medication List:   No orders of the defined types were placed in this encounter.   Medications Discontinued During This Encounter  Medication Reason   levothyroxine  (SYNTHROID ) 88 MCG tablet Dose change     Current Outpatient Medications:    atorvastatin  (LIPITOR) 20 MG tablet, Take 1 tablet (20 mg total) by mouth at bedtime., Disp: 90 tablet, Rfl: 3   cetirizine (ZYRTEC ALLERGY) 10 MG tablet, Take 1 tablet by mouth daily., Disp: , Rfl:    glucose blood test strip, Check sugar once daily, Disp: 100 each, Rfl: 3   glucose monitoring kit (FREESTYLE) monitoring kit, 1 each by Does not apply route as needed for other., Disp: 1 each, Rfl: 0   Lancets (FREESTYLE) lancets, Check once daily, Disp: 100 each, Rfl: 5   levothyroxine  (SYNTHROID ) 50 MCG tablet, Take 50 mcg by mouth., Disp: , Rfl:    levothyroxine  (SYNTHROID ) 75 MCG tablet, Take 75 mcg by mouth., Disp: , Rfl:    lisinopril  (ZESTRIL ) 10  MG tablet, Take 1 tablet by mouth daily., Disp: , Rfl:     montelukast (SINGULAIR) 10 MG tablet, Take 10 mg by mouth daily., Disp: , Rfl:   Consent:   NA  Disposition:   1 year follow-up  Her questions and concerns were addressed to her satisfaction. She voices understanding of the recommendations provided during this encounter.    Signed, Madonna Michele HAS, [facility]  A Division of [facility] [address] [place], [place] [address]  [place], [place] [address] 05/27/2024

## 2024-05-27 NOTE — Patient Instructions (Signed)
 Medication Instructions:  No medication changes were made at this visit. Continue current regimen.   *If you need a refill on your cardiac medications before your next appointment, please call your pharmacy*  Lab Work: None ordered today. If you have labs (blood work) drawn today and your tests are completely normal, you will receive your results only by: MyChart Message (if you have MyChart) OR A paper copy in the mail If you have any lab test that is abnormal or we need to change your treatment, we will call you to review the results.  Testing/Procedures: Your physician has requested that you have an echocardiogram prior to 1 year follow-up with Dr. Michele. Echocardiography is a painless test that uses sound waves to create images of your heart. It provides your doctor with information about the size and shape of your heart and how well your heart's chambers and valves are working. This procedure takes approximately one hour. There are no restrictions for this procedure. Please do NOT wear cologne, perfume, aftershave, or lotions (deodorant is allowed). Please arrive 15 minutes prior to your appointment time.  Please note: We ask at that you not bring children with you during ultrasound (echo/ vascular) testing. Due to room size and safety concerns, children are not allowed in the ultrasound rooms during exams. Our front office staff cannot provide observation of children in our lobby area while testing is being conducted. An adult accompanying a patient to their appointment will only be allowed in the ultrasound room at the discretion of the ultrasound technician under special circumstances. We apologize for any inconvenience.   Follow-Up: At [facility], you and your health needs are our priority.  As part of our continuing mission to provide you with exceptional heart care, our providers are all part of one team.  This team includes your primary Cardiologist (physician) and Advanced  Practice Providers or APPs (Physician Assistants and Nurse Practitioners) who all work together to provide you with the care you need, when you need it.  Your next appointment:   1 year(s)  Provider:   Madonna Michele, DO

## 2024-08-23 ENCOUNTER — Encounter (INDEPENDENT_AMBULATORY_CARE_PROVIDER_SITE_OTHER): Payer: Self-pay

## 2024-08-27 ENCOUNTER — Encounter (INDEPENDENT_AMBULATORY_CARE_PROVIDER_SITE_OTHER): Payer: Self-pay

## 2025-05-17 ENCOUNTER — Other Ambulatory Visit (HOSPITAL_COMMUNITY)
